# Patient Record
Sex: Female | Born: 1935 | Hispanic: No | Smoking: Current some day smoker
Health system: Southern US, Community
[De-identification: ages and names within clinical notes are randomized; demographics above are authoritative.]

## PROBLEM LIST (undated history)

## (undated) DIAGNOSIS — F32A Depression, unspecified: Secondary | ICD-10-CM

## (undated) DIAGNOSIS — E785 Hyperlipidemia, unspecified: Secondary | ICD-10-CM

## (undated) DIAGNOSIS — IMO0002 Reserved for concepts with insufficient information to code with codable children: Secondary | ICD-10-CM

## (undated) DIAGNOSIS — F329 Major depressive disorder, single episode, unspecified: Secondary | ICD-10-CM

## (undated) DIAGNOSIS — F419 Anxiety disorder, unspecified: Secondary | ICD-10-CM

## (undated) DIAGNOSIS — D519 Vitamin B12 deficiency anemia, unspecified: Secondary | ICD-10-CM

## (undated) DIAGNOSIS — J449 Chronic obstructive pulmonary disease, unspecified: Secondary | ICD-10-CM

## (undated) HISTORY — PX: CHOLECYSTECTOMY: SHX55

## (undated) HISTORY — PX: ABDOMINAL HYSTERECTOMY: SHX81

## (undated) HISTORY — DX: Vitamin B12 deficiency anemia, unspecified: D51.9

## (undated) HISTORY — PX: GALLBLADDER SURGERY: SHX652

---

## 1999-02-05 ENCOUNTER — Encounter: Payer: Self-pay | Admitting: Internal Medicine

## 1999-02-05 ENCOUNTER — Ambulatory Visit (HOSPITAL_COMMUNITY): Admission: RE | Admit: 1999-02-05 | Discharge: 1999-02-05 | Payer: Self-pay | Admitting: Internal Medicine

## 1999-03-03 ENCOUNTER — Encounter (INDEPENDENT_AMBULATORY_CARE_PROVIDER_SITE_OTHER): Payer: Self-pay

## 1999-03-03 ENCOUNTER — Inpatient Hospital Stay (HOSPITAL_COMMUNITY): Admission: RE | Admit: 1999-03-03 | Discharge: 1999-03-05 | Payer: Self-pay | Admitting: *Deleted

## 1999-03-03 ENCOUNTER — Encounter (INDEPENDENT_AMBULATORY_CARE_PROVIDER_SITE_OTHER): Payer: Self-pay | Admitting: Specialist

## 2009-08-01 ENCOUNTER — Emergency Department (HOSPITAL_COMMUNITY): Admission: EM | Admit: 2009-08-01 | Discharge: 2009-08-01 | Payer: Self-pay | Admitting: Family Medicine

## 2009-11-12 ENCOUNTER — Observation Stay (HOSPITAL_COMMUNITY): Admission: EM | Admit: 2009-11-12 | Discharge: 2009-11-13 | Payer: Self-pay | Admitting: Emergency Medicine

## 2009-12-18 ENCOUNTER — Observation Stay (HOSPITAL_COMMUNITY): Admission: EM | Admit: 2009-12-18 | Discharge: 2009-12-20 | Payer: Self-pay | Source: Home / Self Care

## 2010-03-17 LAB — BASIC METABOLIC PANEL
BUN: 8 mg/dL (ref 6–23)
CO2: 26 mEq/L (ref 19–32)
Calcium: 9.3 mg/dL (ref 8.4–10.5)
Chloride: 107 mEq/L (ref 96–112)
Creatinine, Ser: 0.56 mg/dL (ref 0.4–1.2)
GFR calc Af Amer: >60 mL/min (ref >60)
GFR calc non Af Amer: >60 mL/min (ref >60)
Glucose, Bld: 146 mg/dL — ABNORMAL HIGH (ref 70–99)
Potassium: 4.4 mEq/L (ref 3.5–5.1)
Sodium: 137 mEq/L (ref 135–145)

## 2010-03-17 LAB — CARDIAC PANEL(CRET KIN+CKTOT+MB+TROPI)
CK, MB: 2.8 ng/mL (ref 0.3–4.0)
Relative Index: 1.3 (ref 0.0–2.5)
Total CK: 223 U/L — ABNORMAL HIGH (ref 7–177)

## 2010-03-18 LAB — DIFFERENTIAL
Basophils Absolute: 0 10*3/uL (ref 0.0–0.1)
Basophils Absolute: 0 10*3/uL (ref 0.0–0.1)
Basophils Relative: 0 % (ref 0–1)
Lymphocytes Relative: 8 % — ABNORMAL LOW (ref 12–46)
Lymphocytes Relative: 17 % (ref 12–46)
Lymphs Abs: 1.1 10*3/uL (ref 0.7–4.0)
Neutro Abs: 9 10*3/uL — ABNORMAL HIGH (ref 1.7–7.7)
Neutro Abs: 4.5 10*3/uL (ref 1.7–7.7)
Neutrophils Relative %: 83 % — ABNORMAL HIGH (ref 43–77)

## 2010-03-18 LAB — POCT CARDIAC MARKERS
CKMB, poc: 1.1 ng/mL (ref 1.0–8.0)
CKMB, poc: <1 ng/mL — ABNORMAL LOW (ref 1.0–8.0)
Myoglobin, poc: 94.1 ng/mL (ref 12–200)
Myoglobin, poc: 56.4 ng/mL (ref 12–200)
Troponin i, poc: <0.05 ng/mL (ref 0.00–0.09)
Troponin i, poc: <0.05 ng/mL (ref 0.00–0.09)

## 2010-03-18 LAB — CARDIAC PANEL(CRET KIN+CKTOT+MB+TROPI)
CK, MB: 1.4 ng/mL (ref 0.3–4.0)
CK, MB: 1.6 ng/mL (ref 0.3–4.0)
Relative Index: 1.2 (ref 0.0–2.5)
Relative Index: 1.2 (ref 0.0–2.5)
Relative Index: INVALID (ref 0.0–2.5)
Total CK: 204 U/L — ABNORMAL HIGH (ref 7–177)
Total CK: 51 U/L (ref 7–177)
Troponin I: 0.02 ng/mL (ref 0.00–0.06)
Troponin I: <0.01 ng/mL (ref 0.00–0.06)
Troponin I: 0.01 ng/mL (ref 0.00–0.06)

## 2010-03-18 LAB — URINALYSIS, ROUTINE W REFLEX MICROSCOPIC
Bilirubin Urine: NEGATIVE
Bilirubin Urine: NEGATIVE
Glucose, UA: NEGATIVE mg/dL
Hgb urine dipstick: NEGATIVE
Ketones, ur: NEGATIVE mg/dL
Nitrite: NEGATIVE
Nitrite: NEGATIVE
Protein, ur: NEGATIVE mg/dL
Specific Gravity, Urine: 1.015 (ref 1.005–1.030)
Specific Gravity, Urine: 1.01 (ref 1.005–1.030)
Urobilinogen, UA: 0.2 mg/dL (ref 0.0–1.0)
pH: 6.5 (ref 5.0–8.0)

## 2010-03-18 LAB — CBC
HCT: 38.6 % (ref 36.0–46.0)
MCHC: 33.4 g/dL (ref 30.0–36.0)
Platelets: 163 10*3/uL (ref 150–400)
Platelets: 174 10*3/uL (ref 150–400)
RBC: 4.36 MIL/uL (ref 3.87–5.11)
RDW: 14.3 % (ref 11.5–15.5)
RDW: 13.5 % (ref 11.5–15.5)
WBC: 10.8 10*3/uL — ABNORMAL HIGH (ref 4.0–10.5)
WBC: 6.2 10*3/uL (ref 4.0–10.5)

## 2010-03-18 LAB — BASIC METABOLIC PANEL
BUN: 6 mg/dL (ref 6–23)
Calcium: 9.2 mg/dL (ref 8.4–10.5)
GFR calc non Af Amer: >60 mL/min (ref >60)
Glucose, Bld: 120 mg/dL — ABNORMAL HIGH (ref 70–99)
Potassium: 4 mEq/L (ref 3.5–5.1)

## 2010-03-18 LAB — TROPONIN I
Troponin I: <0.01 ng/mL (ref 0.00–0.06)
Troponin I: 0.02 ng/mL (ref 0.00–0.06)

## 2010-03-18 LAB — POCT I-STAT, CHEM 8
BUN: 11 mg/dL (ref 6–23)
Chloride: 104 mEq/L (ref 96–112)
Potassium: 4.5 mEq/L (ref 3.5–5.1)
Sodium: 137 mEq/L (ref 135–145)

## 2010-03-18 LAB — URINE MICROSCOPIC-ADD ON

## 2010-03-18 LAB — CK TOTAL AND CKMB (NOT AT ARMC)
Relative Index: INVALID (ref 0.0–2.5)
Total CK: 169 U/L (ref 7–177)

## 2010-12-03 ENCOUNTER — Inpatient Hospital Stay (HOSPITAL_BASED_OUTPATIENT_CLINIC_OR_DEPARTMENT_OTHER)
Admission: EM | Admit: 2010-12-03 | Discharge: 2010-12-12 | DRG: 189 | Disposition: A | Discharge status: Home Health | Payer: Medicare Other | Attending: Internal Medicine | Admitting: Internal Medicine

## 2010-12-03 ENCOUNTER — Other Ambulatory Visit: Payer: Self-pay

## 2010-12-03 ENCOUNTER — Encounter: Payer: Self-pay | Admitting: Emergency Medicine

## 2010-12-03 ENCOUNTER — Emergency Department (INDEPENDENT_AMBULATORY_CARE_PROVIDER_SITE_OTHER): Payer: Medicare Other

## 2010-12-03 DIAGNOSIS — J9620 Acute and chronic respiratory failure, unspecified whether with hypoxia or hypercapnia: Principal | ICD-10-CM | POA: Diagnosis present

## 2010-12-03 DIAGNOSIS — R0602 Shortness of breath: Secondary | ICD-10-CM

## 2010-12-03 DIAGNOSIS — IMO0001 Reserved for inherently not codable concepts without codable children: Secondary | ICD-10-CM

## 2010-12-03 DIAGNOSIS — F17200 Nicotine dependence, unspecified, uncomplicated: Secondary | ICD-10-CM | POA: Diagnosis present

## 2010-12-03 DIAGNOSIS — F329 Major depressive disorder, single episode, unspecified: Secondary | ICD-10-CM | POA: Diagnosis present

## 2010-12-03 DIAGNOSIS — R05 Cough: Secondary | ICD-10-CM

## 2010-12-03 DIAGNOSIS — R911 Solitary pulmonary nodule: Secondary | ICD-10-CM

## 2010-12-03 DIAGNOSIS — I959 Hypotension, unspecified: Secondary | ICD-10-CM | POA: Diagnosis present

## 2010-12-03 DIAGNOSIS — R748 Abnormal levels of other serum enzymes: Secondary | ICD-10-CM

## 2010-12-03 DIAGNOSIS — R000 Tachycardia, unspecified: Secondary | ICD-10-CM

## 2010-12-03 DIAGNOSIS — J209 Acute bronchitis, unspecified: Secondary | ICD-10-CM | POA: Diagnosis present

## 2010-12-03 DIAGNOSIS — I2510 Atherosclerotic heart disease of native coronary artery without angina pectoris: Secondary | ICD-10-CM

## 2010-12-03 DIAGNOSIS — J440 Chronic obstructive pulmonary disease with acute lower respiratory infection: Secondary | ICD-10-CM | POA: Diagnosis present

## 2010-12-03 DIAGNOSIS — R918 Other nonspecific abnormal finding of lung field: Secondary | ICD-10-CM

## 2010-12-03 DIAGNOSIS — F411 Generalized anxiety disorder: Secondary | ICD-10-CM | POA: Diagnosis present

## 2010-12-03 DIAGNOSIS — E785 Hyperlipidemia, unspecified: Secondary | ICD-10-CM | POA: Diagnosis present

## 2010-12-03 DIAGNOSIS — F3289 Other specified depressive episodes: Secondary | ICD-10-CM | POA: Diagnosis present

## 2010-12-03 DIAGNOSIS — J441 Chronic obstructive pulmonary disease with (acute) exacerbation: Secondary | ICD-10-CM | POA: Diagnosis present

## 2010-12-03 DIAGNOSIS — Z23 Encounter for immunization: Secondary | ICD-10-CM

## 2010-12-03 DIAGNOSIS — Z859 Personal history of malignant neoplasm, unspecified: Secondary | ICD-10-CM

## 2010-12-03 DIAGNOSIS — IMO0002 Reserved for concepts with insufficient information to code with codable children: Secondary | ICD-10-CM

## 2010-12-03 DIAGNOSIS — Z79899 Other long term (current) drug therapy: Secondary | ICD-10-CM

## 2010-12-03 DIAGNOSIS — R509 Fever, unspecified: Secondary | ICD-10-CM

## 2010-12-03 DIAGNOSIS — M6282 Rhabdomyolysis: Secondary | ICD-10-CM | POA: Diagnosis present

## 2010-12-03 DIAGNOSIS — J9621 Acute and chronic respiratory failure with hypoxia: Secondary | ICD-10-CM | POA: Diagnosis present

## 2010-12-03 DIAGNOSIS — D696 Thrombocytopenia, unspecified: Secondary | ICD-10-CM | POA: Diagnosis present

## 2010-12-03 DIAGNOSIS — Z7982 Long term (current) use of aspirin: Secondary | ICD-10-CM

## 2010-12-03 DIAGNOSIS — R7989 Other specified abnormal findings of blood chemistry: Secondary | ICD-10-CM

## 2010-12-03 DIAGNOSIS — Z9981 Dependence on supplemental oxygen: Secondary | ICD-10-CM

## 2010-12-03 HISTORY — DX: Hyperlipidemia, unspecified: E78.5

## 2010-12-03 HISTORY — DX: Major depressive disorder, single episode, unspecified: F32.9

## 2010-12-03 HISTORY — DX: Depression, unspecified: F32.A

## 2010-12-03 HISTORY — DX: Anxiety disorder, unspecified: F41.9

## 2010-12-03 HISTORY — DX: Chronic obstructive pulmonary disease, unspecified: J44.9

## 2010-12-03 HISTORY — DX: Reserved for concepts with insufficient information to code with codable children: IMO0002

## 2010-12-03 LAB — COMPREHENSIVE METABOLIC PANEL
Albumin: 4 g/dL (ref 3.5–5.2)
Alkaline Phosphatase: 63 U/L (ref 39–117)
BUN: 8 mg/dL (ref 6–23)
Calcium: 9.1 mg/dL (ref 8.4–10.5)
GFR calc Af Amer: >90 mL/min (ref >90)
Glucose, Bld: 100 mg/dL — ABNORMAL HIGH (ref 70–99)
Potassium: 4.2 mEq/L (ref 3.5–5.1)
Sodium: 136 mEq/L (ref 135–145)
Total Protein: 6.7 g/dL (ref 6.0–8.3)

## 2010-12-03 LAB — POCT I-STAT 3, ART BLOOD GAS (G3+)
O2 Saturation: 98 %
Patient temperature: 100
pO2, Arterial: 106 mmHg — ABNORMAL HIGH (ref 80.0–100.0)

## 2010-12-03 LAB — PRO B NATRIURETIC PEPTIDE: Pro B Natriuretic peptide (BNP): 997.1 pg/mL — ABNORMAL HIGH (ref 0–125)

## 2010-12-03 LAB — CBC
MCH: 32.8 pg (ref 26.0–34.0)
MCHC: 33.1 g/dL (ref 30.0–36.0)
MCV: 99 fL (ref 78.0–100.0)
Platelets: 146 10*3/uL — ABNORMAL LOW (ref 150–400)

## 2010-12-03 LAB — MAGNESIUM: Magnesium: 1.5 mg/dL (ref 1.5–2.5)

## 2010-12-03 LAB — DIFFERENTIAL
Basophils Relative: 0 % (ref 0–1)
Eosinophils Absolute: 0 10*3/uL (ref 0.0–0.7)
Eosinophils Relative: 0 % (ref 0–5)
Monocytes Relative: 10 % (ref 3–12)
Neutrophils Relative %: 85 % — ABNORMAL HIGH (ref 43–77)

## 2010-12-03 LAB — CARDIAC PANEL(CRET KIN+CKTOT+MB+TROPI)
Relative Index: 3 — ABNORMAL HIGH (ref 0.0–2.5)
Total CK: 179 U/L — ABNORMAL HIGH (ref 7–177)
Troponin I: <0.3 ng/mL (ref <0.30)

## 2010-12-03 MED ORDER — ALUM & MAG HYDROXIDE-SIMETH 200-200-20 MG/5ML PO SUSP: 30.0000 mL | Freq: Four times a day (QID) | ORAL | Status: DC | PRN

## 2010-12-03 MED ORDER — MOXIFLOXACIN HCL IN NACL 400 MG/250ML IV SOLN
400.0000 mg | Freq: Once | INTRAVENOUS | Status: AC
  Administered 2010-12-03: 400 mg via INTRAVENOUS
  Filled 2010-12-03: qty 250

## 2010-12-03 MED ORDER — ACETAMINOPHEN 325 MG PO TABS
650.0000 mg | ORAL_TABLET | Freq: Four times a day (QID) | ORAL | Status: DC | PRN
Start: 2010-12-03 — End: 2010-12-12

## 2010-12-03 MED ORDER — METHYLPREDNISOLONE SODIUM SUCC 125 MG IJ SOLR
125.0000 mg | Freq: Once | INTRAMUSCULAR | Status: AC
  Administered 2010-12-03: 125 mg via INTRAVENOUS
  Filled 2010-12-03: qty 2

## 2010-12-03 MED ORDER — INFLUENZA VIRUS VACC SPLIT PF IM SUSP
0.5000 mL | INTRAMUSCULAR | Status: AC
  Administered 2010-12-04: 0.5 mL via INTRAMUSCULAR
  Filled 2010-12-03: qty 0.5

## 2010-12-03 MED ORDER — ALBUTEROL (5 MG/ML) CONTINUOUS INHALATION SOLN
10.0000 mg/h | INHALATION_SOLUTION | Freq: Once | RESPIRATORY_TRACT | Status: DC
  Administered 2010-12-03: 10 mg/h via RESPIRATORY_TRACT

## 2010-12-03 MED ORDER — FLUOXETINE HCL 20 MG PO CAPS
20.0000 mg | ORAL_CAPSULE | Freq: Every day | ORAL | Status: DC
  Administered 2010-12-03 – 2010-12-12 (×10): 20 mg via ORAL
  Filled 2010-12-03 (×10): qty 1

## 2010-12-03 MED ORDER — ALBUTEROL SULFATE (5 MG/ML) 0.5% IN NEBU
2.5000 mg | INHALATION_SOLUTION | RESPIRATORY_TRACT | Status: DC
  Administered 2010-12-03 (×2): 2.5 mg via RESPIRATORY_TRACT
  Filled 2010-12-03: qty 0.5

## 2010-12-03 MED ORDER — LEVALBUTEROL HCL 0.63 MG/3ML IN NEBU
0.6300 mg | INHALATION_SOLUTION | Freq: Four times a day (QID) | RESPIRATORY_TRACT | Status: DC
  Administered 2010-12-04 – 2010-12-12 (×30): 0.63 mg via RESPIRATORY_TRACT
  Filled 2010-12-03 (×43): qty 3

## 2010-12-03 MED ORDER — SENNOSIDES-DOCUSATE SODIUM 8.6-50 MG PO TABS
1.0000 | ORAL_TABLET | Freq: Every day | ORAL | Status: DC | PRN
  Filled 2010-12-03: qty 1

## 2010-12-03 MED ORDER — ENOXAPARIN SODIUM 40 MG/0.4ML ~~LOC~~ SOLN
40.0000 mg | SUBCUTANEOUS | Status: DC
  Administered 2010-12-03 – 2010-12-11 (×9): 40 mg via SUBCUTANEOUS
  Filled 2010-12-03 (×10): qty 0.4

## 2010-12-03 MED ORDER — ALBUTEROL SULFATE (5 MG/ML) 0.5% IN NEBU
INHALATION_SOLUTION | RESPIRATORY_TRACT | Status: AC
  Filled 2010-12-03: qty 0.5

## 2010-12-03 MED ORDER — GUAIFENESIN ER 600 MG PO TB12
600.0000 mg | ORAL_TABLET | Freq: Two times a day (BID) | ORAL | Status: DC
  Administered 2010-12-03 – 2010-12-06 (×6): 600 mg via ORAL
  Filled 2010-12-03 (×7): qty 1

## 2010-12-03 MED ORDER — IPRATROPIUM BROMIDE 0.02 % IN SOLN
RESPIRATORY_TRACT | Status: AC
  Filled 2010-12-03: qty 2.5

## 2010-12-03 MED ORDER — PANTOPRAZOLE SODIUM 40 MG PO TBEC
40.0000 mg | DELAYED_RELEASE_TABLET | Freq: Every day | ORAL | Status: DC
  Administered 2010-12-03 – 2010-12-12 (×10): 40 mg via ORAL
  Filled 2010-12-03 (×10): qty 1

## 2010-12-03 MED ORDER — MOXIFLOXACIN HCL 400 MG PO TABS
400.0000 mg | ORAL_TABLET | Freq: Every day | ORAL | Status: DC
  Administered 2010-12-03 – 2010-12-10 (×8): 400 mg via ORAL
  Filled 2010-12-03 (×9): qty 1

## 2010-12-03 MED ORDER — ACETAMINOPHEN 650 MG RE SUPP: 650.0000 mg | Freq: Four times a day (QID) | RECTAL | Status: DC | PRN

## 2010-12-03 MED ORDER — NITROGLYCERIN 0.4 MG SL SUBL
0.4000 mg | SUBLINGUAL_TABLET | SUBLINGUAL | Status: DC | PRN
Start: 2010-12-03 — End: 2010-12-12

## 2010-12-03 MED ORDER — GUAIFENESIN-DM 100-10 MG/5ML PO SYRP
5.0000 mL | ORAL_SOLUTION | ORAL | Status: DC | PRN
  Administered 2010-12-04 – 2010-12-10 (×18): 5 mL via ORAL
  Filled 2010-12-03 (×16): qty 5

## 2010-12-03 MED ORDER — FLUOXETINE HCL 20 MG PO TABS: 20.0000 mg | ORAL_TABLET | Freq: Every day | ORAL | Status: DC

## 2010-12-03 MED ORDER — IPRATROPIUM BROMIDE 0.02 % IN SOLN
0.5000 mg | Freq: Four times a day (QID) | RESPIRATORY_TRACT | Status: DC
  Administered 2010-12-04 – 2010-12-12 (×29): 0.5 mg via RESPIRATORY_TRACT
  Filled 2010-12-03 (×32): qty 2.5

## 2010-12-03 MED ORDER — ONDANSETRON HCL 4 MG/2ML IJ SOLN: 4.0000 mg | Freq: Four times a day (QID) | INTRAMUSCULAR | Status: DC | PRN

## 2010-12-03 MED ORDER — PREDNISONE 50 MG PO TABS
60.0000 mg | ORAL_TABLET | Freq: Two times a day (BID) | ORAL | Status: DC
  Administered 2010-12-03 – 2010-12-04 (×2): 60 mg via ORAL
  Filled 2010-12-03 (×3): qty 1

## 2010-12-03 MED ORDER — IOHEXOL 350 MG/ML SOLN
100.0000 mL | Freq: Once | INTRAVENOUS | Status: AC | PRN
  Administered 2010-12-03: 100 mL via INTRAVENOUS

## 2010-12-03 MED ORDER — SIMVASTATIN 40 MG PO TABS
40.0000 mg | ORAL_TABLET | Freq: Every day | ORAL | Status: DC
  Administered 2010-12-03 – 2010-12-11 (×9): 40 mg via ORAL
  Filled 2010-12-03 (×10): qty 1

## 2010-12-03 MED ORDER — BIOTENE DRY MOUTH MT LIQD
15.0000 mL | Freq: Two times a day (BID) | OROMUCOSAL | Status: DC
  Administered 2010-12-04 – 2010-12-12 (×16): 15 mL via OROMUCOSAL

## 2010-12-03 MED ORDER — ASPIRIN 81 MG PO CHEW
81.0000 mg | CHEWABLE_TABLET | Freq: Every day | ORAL | Status: DC
  Administered 2010-12-03 – 2010-12-12 (×10): 81 mg via ORAL
  Filled 2010-12-03 (×10): qty 1

## 2010-12-03 MED ORDER — IPRATROPIUM BROMIDE 0.02 % IN SOLN
0.5000 mg | RESPIRATORY_TRACT | Status: DC
  Administered 2010-12-03 (×2): 0.5 mg via RESPIRATORY_TRACT
  Filled 2010-12-03: qty 2.5

## 2010-12-03 MED ORDER — ALBUTEROL (5 MG/ML) CONTINUOUS INHALATION SOLN
10.0000 mg/h | INHALATION_SOLUTION | RESPIRATORY_TRACT | Status: DC
  Filled 2010-12-03: qty 0.5
  Filled 2010-12-03: qty 20

## 2010-12-03 MED ORDER — ONDANSETRON HCL 4 MG PO TABS: 4.0000 mg | ORAL_TABLET | Freq: Four times a day (QID) | ORAL | Status: DC | PRN

## 2010-12-03 MED ORDER — SODIUM CHLORIDE 0.9 % IV SOLN
INTRAVENOUS | Status: AC
  Administered 2010-12-03: 23:00:00 via INTRAVENOUS

## 2010-12-03 MED ORDER — FLUTICASONE-SALMETEROL 250-50 MCG/DOSE IN AEPB
1.0000 | INHALATION_SPRAY | Freq: Two times a day (BID) | RESPIRATORY_TRACT | Status: DC
  Administered 2010-12-04 – 2010-12-12 (×11): 1 via RESPIRATORY_TRACT
  Filled 2010-12-03 (×2): qty 14

## 2010-12-03 MED ORDER — HYDROCODONE-ACETAMINOPHEN 5-325 MG PO TABS
1.0000 | ORAL_TABLET | ORAL | Status: DC | PRN
  Administered 2010-12-09 – 2010-12-10 (×2): 1 via ORAL
  Filled 2010-12-03: qty 1
  Filled 2010-12-03: qty 2

## 2010-12-03 NOTE — ED Notes (Signed)
Pt resting much more comfortably now, resp rate decreased to 24, O2 sats 100%

## 2010-12-03 NOTE — H&P (Signed)
PCP:  Selena Batten Cardiologist: [facility] cardiology Dr. Isabel Caprice  Chief Complaint:  Short of breath/cough  HPI: Kaitlyn Snyder is a 75 y.o. female   has a past medical history of COPD (chronic obstructive pulmonary disease); Hyperlipidemia; Depression; Squamous carcinoma; and Anxiety.   Presented with  For the past 24 hours she has experienced increased work of breathing increased shortness of breath she hadn't had some cough productive of green mucus. She had low-grade temperature to 100.1. Patient has been on prednisone since October for ongoing COPD. She continues to smoke about a pack a day. She had not any chest pain. When she first presented to [facility] emergency department she was living 40 times a minute and were wheezing extensively. She was given Solu-Medrol given albuterol and Atrovent and put on BiPAP. After her transfer to [facility] step down she improved and currently wishes to BE taken off BiPAP she is feeling better  Review of Systems:    Pertinent Positives: Shortness of breath, fevers, cough, poor appetite  Pertinent Negatives: No chest pain no nausea no vomiting no diarrhea no constipation no myalgias no stiff neck Otherwise ROS are negative except for above, 10 systems were reviewed  Past Medical History: Past Medical History  Diagnosis Date  . COPD (chronic obstructive pulmonary disease)   . Hyperlipidemia   . Depression   . Squamous carcinoma   . Anxiety    Past Surgical History  Procedure Date  . Abdominal hysterectomy   . Cholecystectomy      Medications: Prior to Admission medications   Medication Sig Start Date End Date Taking? Authorizing Provider  albuterol (PROVENTIL HFA;VENTOLIN HFA) 108 (90 BASE) MCG/ACT inhaler Inhale 2 puffs into the lungs every 4 (four) hours as needed.     Yes Historical Provider, MD  aspirin 81 MG tablet Take 81 mg by mouth daily.     Yes Historical Provider, MD  Calcium Carbonate-Vitamin D (CALTRATE 600+D) 600-400  MG-UNIT per tablet Take 1 tablet by mouth 2 (two) times daily.     Yes Historical Provider, MD  cholecalciferol (VITAMIN D) 1000 UNITS tablet Take 4,000 Units by mouth daily.     Yes Historical Provider, MD  conjugated estrogens (PREMARIN) vaginal cream Place 1 g vaginally 3 (three) times a week. Use on Monday, Wednesday and Friday    Yes Historical Provider, MD  Cyanocobalamin (VITAMIN B 12 PO) Take 2,000 mg by mouth.     Yes Historical Provider, MD  cyanocobalamin 2000 MCG tablet Take 2,000 mcg by mouth daily.     Yes Historical Provider, MD  estrogens, conjugated, (PREMARIN) 0.625 MG tablet Take 0.625 mg by mouth daily. As directed    Yes Historical Provider, MD  FLUoxetine (PROZAC) 20 MG tablet Take 20 mg by mouth daily.     Yes Historical Provider, MD  omeprazole (PRILOSEC) 20 MG capsule Take 20 mg by mouth 2 (two) times daily.     Yes Historical Provider, MD  predniSONE (DELTASONE) 10 MG tablet Take 10 mg by mouth every other day.    Yes Historical Provider, MD  simvastatin (ZOCOR) 40 MG tablet Take 40 mg by mouth at bedtime.     Yes Historical Provider, MD  tiotropium (SPIRIVA) 18 MCG inhalation capsule Place 18 mcg into inhaler and inhale daily.     Yes Historical Provider, MD  nitroGLYCERIN (NITROSTAT) 0.4 MG SL tablet Place 0.4 mg under the tongue every 5 (five) minutes as needed.      Historical Provider, MD  Allergies:  No Known Allergies  Social History:  reports that she has been smoking Cigarettes.  She has a 55 pack-year smoking history. She does not have any smokeless tobacco history on file. She reports that she drinks about 6 ounces of alcohol per week. She reports that she does not use illicit drugs.   Family History: family history includes Kidney failure in her brother.    Physical Exam: Patient Vitals for the past 24 hrs:  BP Temp Temp src Pulse Resp SpO2 Height Weight  12/03/10 2118 123/53 mmHg - - 109  23  95 % - -  12/03/10 2009 - - - - - 99 % - -  12/03/10  2000 104/38 mmHg - - 114  27  99 % - -  12/03/10 1929 - 99.5 F (37.5 C) Axillary - - - - -  12/03/10 1926 130/52 mmHg - - 119  - 99 % - -  12/03/10 1717 124/38 mmHg - - 134  33  100 % - -  12/03/10 1644 124/38 mmHg 99.1 F (37.3 C) Oral 142  32  96 % - -  12/03/10 1601 122/43 mmHg - - 130  30  98 % - -  12/03/10 1525 - - - - - 100 % - -  12/03/10 1458 - - - - - 100 % - -  12/03/10 1435 125/43 mmHg 100 F (37.8 C) Oral 129  32  97 % - -  12/03/10 1424 - - - - - 100 % - -  12/03/10 1313 - - - - - 100 % - -  12/03/10 1304 155/52 mmHg 98.5 F (36.9 C) Oral 131  42  - 5\' 2"  (1.575 m) 60.782 kg (134 lb)   Upon arrival to step down unit her blood pressure hasn't went down to 90s over 40s then spontaneously improved back to systolics of 120 patient remains tachycardic with heart rate in 114, respirations now down to mid 20s  Alert and Oriented in No Acute distress, on BiPAP Mucous Membranes and Skin: Dry decreased skin turgor Head Non traumatic, neck supple Heart: Regular rate and rhythm no murmurs appreciated Lungs: Diminished air movement throughout, no wheezes appreciated no crackles Abdomen: soft nontender nondistended Lower extremities: No clubbing cyanosis or edema Neurologically Grossly intact: Moving all 4 extremities equally Skin clean Dry and intact no rash body mass index is 24.51 kg/(m^2).   Labs on Admission:   [facility] 12/03/10 1320  NA 136  K 4.2  CL 100  CO2 27  GLUCOSE 100*  BUN 8  CREATININE 0.50  CALCIUM 9.1  MG 1.5  PHOS 2.9    Basename 12/03/10 1320  AST 22  ALT 14  ALKPHOS 63  BILITOT 0.7  PROT 6.7  ALBUMIN 4.0   No results found for this basename: LIPASE:2,AMYLASE:2 in the last 72 hours  Basename 12/03/10 1320  WBC 6.1  NEUTROABS 5.2  HGB 13.1  HCT 39.6  MCV 99.0  PLT 146*    Basename 12/03/10 1320  CKTOTAL 179*  CKMB 5.3*  CKMBINDEX --  TROPONINI <0.30   No results found for this basename: TSH,T4TOTAL,FREET3,T3FREE,THYROIDAB in  the last 72 hours No results found for this basename: VITAMINB12:2,FOLATE:2,FERRITIN:2,TIBC:2,IRON:2,RETICCTPCT:2 in the last 72 hours No results found for this basename: HGBA1C    Estimated Creatinine Clearance: 54.6 ml/min (by C-G formula based on Cr of 0.5). ABG    Component Value Date/Time   PHART 7.419* 12/03/2010 1442   HCO3 24.0 12/03/2010 1442   TCO2  25 12/03/2010 1442   O2SAT 98.0 12/03/2010 1442     Lab Results  Component Value Date   DDIMER  Value: 0.47        AT THE INHOUSE ESTABLISHED CUTOFF VALUE OF 0.48 ug/mL FEU, THIS ASSAY HAS BEEN DOCUMENTED IN THE LITERATURE TO HAVE A SENSITIVITY AND NEGATIVE PREDICTIVE VALUE OF AT LEAST 98 TO 99%.  THE TEST RESULT SHOULD BE CORRELATED WITH AN ASSESSMENT OF THE CLINICAL PROBABILITY OF DVT / VTE. 12/18/2009     Other results: ED ECG REPORT  Rate: 111  Rhythm: Sinus tachycardia ST&T Change: No ischemic changes noted    Blood Culture No results found for this basename: sdes, specrequest, cult, reptstatus       Radiological Exams on Admission: Ct Angio Chest W/cm &/or Wo Cm  12/03/2010  *RADIOLOGY REPORT*  Clinical Data:  74-year with shortness of breath and tachycardia. Evaluate for pulmonary embolism.  CT ANGIOGRAPHY CHEST WITH CONTRAST  Technique:  Multidetector CT imaging of the chest was performed using the standard protocol during bolus administration of intravenous contrast.  Multiplanar CT image reconstructions including MIPs were obtained to evaluate the vascular anatomy.  Contrast: OMNIPAQUE IOHEXOL 350 MG/ML IV SOLN  Comparison:  Chest radiograph 12/03/2010  Findings:  No evidence for pulmonary emboli.  There are coronary artery calcifications.  There is pericardial fluid along the anterior aspect of the heart.  No significant pleural fluid.  There may be a small hiatal hernia but there is also concern for distal esophageal wall thickening.   No evidence for mediastinal or hilar lymphadenopathy. There is  atherosclerotic disease involving the aortic arch and possible stenosis at the origin of the left subclavian artery.  The trachea and mainstem bronchi are patent.  5 mm pulmonary nodule in the left lower lobe on image 67.  6 mm nodule in the left lower lobe on image 61 and there is a peripheral 6 mm nodule in the posterior left lower lobe on image 63.  There are emphysematous changes in the upper lungs.  A few peripheral nodular densities in the anterior right middle lobe on image 58.  These right middle lobe nodules could be post inflammatory in etiology.  No acute bony abnormality.  Review of the MIP images confirms the above findings.  IMPRESSION: No evidence for a pulmonary embolism.  Concern for wall thickening of the distal esophagus.  These findings are nonspecific and could be further evaluated with an esophagram or endoscopy.  There may be a small hiatal hernia.  Emphysematous changes with a few bilateral pulmonary nodules.  The largest pulmonary nodule measures 6 mm in the left lower lobe. These nodules are indeterminate.  If the patient is at high risk for bronchogenic carcinoma, follow-up chest CT at 6-12 months is recommended.  If the patient is at low risk for bronchogenic carcinoma, follow-up chest CT at 12 months is recommended.  This recommendation follows the consensus statement: Guidelines for Management of Small Pulmonary Nodules Detected on CT Scans: A Statement from the Fleischner Society as published in Radiology 2005; 237:395-400. Online at: DietDisorder.cz.  Coronary artery calcifications.  Small amount of pericardial fluid.  Original Report Authenticated By: Richarda Overlie, M.D.   Dg Chest Portable 1 View  12/03/2010  *RADIOLOGY REPORT*  Clinical Data: Fever, cough, shortness of breath  PORTABLE CHEST - 1 VIEW  Comparison: 12/18/2009  Findings: Heart size is mildly enlarged.  Hyperinflation and prompt interstitial markings are compatible with COPD.  Possible focal consolidation is noted over  the right lower lobe projecting over the right cardiophrenic angle.  No pleural effusion.  IMPRESSION: Possible right lower lobe airspace disease.  Recommend PA and and lateral chest radiographs obtained at full inspiration when the patient is clinically able.  Original Report Authenticated By: Harrel Lemon, M.D.    Assessment/Plan  Present on Admission:  .COPD exacerbation -  - Will initiate prednisone taper, start on Avelox, Albuterol PRN, scheduled Atrovent, ADvair and Mucinex. Titrate O2 to saturation >90%. Follow patients respiratory status.  Ordered tobacco cessation consult  .Hypotension, unspecified - this has currently improved, given gentle IV fluids obtain blood cultures initiate antibiotics.   I doubt this is a sepsis this point but will watch carefully. .Shortness of breath - likely related to COPD given extensive tobacco abuse we will cycle cardiac markers  .Cardiac enzymes elevated - troponin is still within normal limits we'll continue to monitor, continue to watch CK and CK-MB, serial EKGs, currently chest pain-free  .Pulmonary nodules - there is a small patient will need followup CT scan in 6-12 months  .Elevated brain natriuretic peptide (BNP) level - could be possibly related to COPD but given her also shortness of breath will obtain 2D echogram to evaluate cardiac function. At this point she does not appear to be clinically in heart failure     Prophylaxis: Protonix and Lovenox  CODE STATUS: Patient wishes to BE DO NOT RESUSCITATE DO NOT INTUBATE, her friend Lynden Ang is her medical power of attorney   Wren Gallaga 12/03/2010, 9:30 PM

## 2010-12-03 NOTE — ED Notes (Signed)
Arterial Blood Gas drawn on 1st attempt in LR.  Bleeding stopped.  Pt tolerated well. Temp. 100.0.  Pt on 40% VM.

## 2010-12-03 NOTE — ED Provider Notes (Signed)
5:02 PM Asked to see pt because she remains tachycardic and tachypneic despite treatment.  Exam shows lungs clear.  Pt is awake and alert.  She was last hospitalized for COPD last year. She has never been intubated.  Will try her on BiPAP.     Carleene Cooper III, MD 12/04/10 [phone]

## 2010-12-03 NOTE — ED Notes (Signed)
Pt to ED via EMS from MD office w/ c/o [facility] & prod cough x 2 days; hx COPD

## 2010-12-03 NOTE — ED Provider Notes (Signed)
History     CSN: [id number] Arrival date & time: 12/03/2010  1:05 PM   First MD Initiated Contact with Patient 12/03/10 1355      Chief Complaint  Patient presents with  . Shortness of Breath    HPI  74yoF h/o COPD presents with shortness of breath. The patient states that she has been feeling worsening shortness of breath, productive cough for the past 2 days. She's used her albuterol and Spiriva at home with minimal relief of symptoms. States that her shortness of breath became worse this morning she was seen by her primary care doctor who advised her to come to the emergency department via ambulance. The patient states that this feels like her typical COPD. She did have an albuterol nebulizer at her or make her office which helped minimally. She denies chest pain. She denies heart failure. She states that she had fevers at home to 100.8 recently. No sick contacts. She states that her triggers usually include changing weather, perfumes, URI. Denies h/o VTE in self or family. No recent hosp/surg/immob. No h/o cancer. Denies exogenous hormone use, no leg pain or swelling. She did travel to [facility] (3hrs) for Thanksgiving holiday   ED Notes, ED Provider Notes from 12/03/10 0000 to 12/03/10 13:13:08       Shayne Alken, RN 12/03/2010 13:06      Pt to ED via EMS from MD office w/ c/o [facility] & prod cough x 2 days; hx COPD     Past Medical History  Diagnosis Date  . COPD (chronic obstructive pulmonary disease)   . Hyperlipidemia   . Depression   . Squamous carcinoma     Past Surgical History  Procedure Date  . Abdominal hysterectomy   . Cholecystectomy     No family history on file.  History  Substance Use Topics  . Smoking status: Current Everyday Smoker  . Smokeless tobacco: Not on file  . Alcohol Use: Yes     beer q week    OB History    Grav Para Term Preterm Abortions TAB SAB Ect Mult Living                  Review of Systems  All other systems reviewed and  are negative.   except as noted HPI  Allergies  Review of patient's allergies indicates no known allergies.  Home Medications   Current Outpatient Rx  Name Route Sig Dispense Refill  . ALBUTEROL SULFATE HFA 108 (90 BASE) MCG/ACT IN AERS Inhalation Inhale 2 puffs into the lungs every 4 (four) hours as needed.      . ASPIRIN 81 MG PO TABS Oral Take 81 mg by mouth daily.      Marland Kitchen CALCIUM CARBONATE-VITAMIN D 600-400 MG-UNIT PO TABS Oral Take 1 tablet by mouth 2 (two) times daily.      Marland Kitchen VITAMIN D 1000 UNITS PO TABS Oral Take 4,000 Units by mouth daily.      Marland Kitchen ESTROGENS, CONJUGATED 0.625 MG/GM VA CREA Vaginal Place 1 g vaginally 3 (three) times a week. Use on Monday, Wednesday and Friday     . VITAMIN B 12 PO Oral Take 2,000 mg by mouth.      . CYANOCOBALAMIN 2000 MCG PO TABS Oral Take 2,000 mcg by mouth daily.      Marland Kitchen ESTROGENS CONJUGATED 0.625 MG PO TABS Oral Take 0.625 mg by mouth daily. As directed     . FLUOXETINE HCL 20 MG PO TABS Oral Take 20  mg by mouth daily.      Marland Kitchen OMEPRAZOLE 20 MG PO CPDR Oral Take 20 mg by mouth 2 (two) times daily.      Marland Kitchen PREDNISONE 10 MG PO TABS Oral Take 10 mg by mouth every other day.     Marland Kitchen SIMVASTATIN 40 MG PO TABS Oral Take 40 mg by mouth at bedtime.      Marland Kitchen TIOTROPIUM BROMIDE MONOHYDRATE 18 MCG IN CAPS Inhalation Place 18 mcg into inhaler and inhale daily.      Marland Kitchen NITROGLYCERIN 0.4 MG SL SUBL Sublingual Place 0.4 mg under the tongue every 5 (five) minutes as needed.        BP 122/43  Pulse 130  Temp(Src) 100 F (37.8 C) (Oral)  Resp 30  Ht 5\' 2"  (1.575 m)  Wt 134 lb (60.782 kg)  BMI 24.51 kg/m2  SpO2 98%  Physical Exam  Nursing note and vitals reviewed. Constitutional: She is oriented to person, place, and time. She appears well-developed.  HENT:  Head: Atraumatic.  Mouth/Throat: Oropharynx is clear and moist.       Mm dry  Eyes: Conjunctivae and EOM are normal. Pupils are equal, round, and reactive to light.  Neck: Normal range of motion. Neck  supple. No JVD present.  Cardiovascular: Regular rhythm, normal heart sounds and intact distal pulses.        tachycardia  Pulmonary/Chest: No respiratory distress. She has no wheezes. She has no rales.       Kyphosis Distant breath sounds Faint b/ exp wheeze  Speaking in complete sentences  Abdominal: Soft. She exhibits no distension. There is no tenderness. There is no rebound and no guarding.  Musculoskeletal: Normal range of motion. She exhibits no edema and no tenderness.  Neurological: She is alert and oriented to person, place, and time.  Skin: Skin is warm and dry. No rash noted.  Psychiatric: She has a normal mood and affect.    Date: 12/03/2010  Rate: 127   Rhythm: sinus tachycardia  QRS Axis: normal  Intervals: normal  ST/T Wave abnormalities: nonspecific ST changes  Conduction Disutrbances:none  Narrative Interpretation:   Old EKG Reviewed: unchanged   ED Course  Procedures (including critical care time)  Labs Reviewed  CBC - Abnormal; Notable for the following:    Platelets 146 (*)    All other components within normal limits  DIFFERENTIAL - Abnormal; Notable for the following:    Neutrophils Relative 85 (*)    Lymphocytes Relative 5 (*)    Lymphs Abs 0.3 (*)    All other components within normal limits  COMPREHENSIVE METABOLIC PANEL - Abnormal; Notable for the following:    Glucose, Bld 100 (*)    All other components within normal limits  CARDIAC PANEL(CRET KIN+CKTOT+MB+TROPI) - Abnormal; Notable for the following:    Total CK 179 (*)    CK, MB 5.3 (*)    Relative Index 3.0 (*)    All other components within normal limits  PRO B NATRIURETIC PEPTIDE - Abnormal; Notable for the following:    BNP, POC 997.1 (*)    All other components within normal limits  POCT I-STAT 3, BLOOD GAS (G3+) - Abnormal; Notable for the following:    pH, Arterial 7.419 (*)    pO2, Arterial 106.0 (*)    All other components within normal limits   Ct Angio Chest W/cm &/or Wo  Cm  12/03/2010  *RADIOLOGY REPORT*  Clinical Data:  74-year with shortness of breath and tachycardia. Evaluate  for pulmonary embolism.  CT ANGIOGRAPHY CHEST WITH CONTRAST  Technique:  Multidetector CT imaging of the chest was performed using the standard protocol during bolus administration of intravenous contrast.  Multiplanar CT image reconstructions including MIPs were obtained to evaluate the vascular anatomy.  Contrast: OMNIPAQUE IOHEXOL 350 MG/ML IV SOLN  Comparison:  Chest radiograph 12/03/2010  Findings:  No evidence for pulmonary emboli.  There are coronary artery calcifications.  There is pericardial fluid along the anterior aspect of the heart.  No significant pleural fluid.  There may be a small hiatal hernia but there is also concern for distal esophageal wall thickening.   No evidence for mediastinal or hilar lymphadenopathy. There is atherosclerotic disease involving the aortic arch and possible stenosis at the origin of the left subclavian artery.  The trachea and mainstem bronchi are patent.  5 mm pulmonary nodule in the left lower lobe on image 67.  6 mm nodule in the left lower lobe on image 61 and there is a peripheral 6 mm nodule in the posterior left lower lobe on image 63.  There are emphysematous changes in the upper lungs.  A few peripheral nodular densities in the anterior right middle lobe on image 58.  These right middle lobe nodules could be post inflammatory in etiology.  No acute bony abnormality.  Review of the MIP images confirms the above findings.  IMPRESSION: No evidence for a pulmonary embolism.  Concern for wall thickening of the distal esophagus.  These findings are nonspecific and could be further evaluated with an esophagram or endoscopy.  There may be a small hiatal hernia.  Emphysematous changes with a few bilateral pulmonary nodules.  The largest pulmonary nodule measures 6 mm in the left lower lobe. These nodules are indeterminate.  If the patient is at high risk  for bronchogenic carcinoma, follow-up chest CT at 6-12 months is recommended.  If the patient is at low risk for bronchogenic carcinoma, follow-up chest CT at 12 months is recommended.  This recommendation follows the consensus statement: Guidelines for Management of Small Pulmonary Nodules Detected on CT Scans: A Statement from the Fleischner Society as published in Radiology 2005; 237:395-400. Online at: DietDisorder.cz.  Coronary artery calcifications.  Small amount of pericardial fluid.  Original Report Authenticated By: Richarda Overlie, M.D.   Dg Chest Portable 1 View  12/03/2010  *RADIOLOGY REPORT*  Clinical Data: Fever, cough, shortness of breath  PORTABLE CHEST - 1 VIEW  Comparison: 12/18/2009  Findings: Heart size is mildly enlarged.  Hyperinflation and prompt interstitial markings are compatible with COPD. Possible focal consolidation is noted over the right lower lobe projecting over the right cardiophrenic angle.  No pleural effusion.  IMPRESSION: Possible right lower lobe airspace disease.  Recommend PA and and lateral chest radiographs obtained at full inspiration when the patient is clinically able.  Original Report Authenticated By: Harrel Lemon, M.D.    1. COPD bronchitis   2. Shortness of breath   3. Pulmonary nodules   4. Cardiac enzymes elevated   5. Elevated brain natriuretic peptide (BNP) level     MDM  Likely COPD exacerbation. Duoneb, steroids, avelox. Labs, EKG, CXR. Reassess.  Anticipate admission.  Stefano Gaul, MD  CTA negative for PE. Multiple b/l nodules. No infiltrate on CT. Pt getting hour long neb now. D/W [facility] Dr. Jordan Hawks. Admit to stepdown         Forbes Cellar, MD 12/03/10 1626

## 2010-12-04 DIAGNOSIS — J449 Chronic obstructive pulmonary disease, unspecified: Secondary | ICD-10-CM

## 2010-12-04 DIAGNOSIS — R0989 Other specified symptoms and signs involving the circulatory and respiratory systems: Secondary | ICD-10-CM

## 2010-12-04 DIAGNOSIS — R0609 Other forms of dyspnea: Secondary | ICD-10-CM

## 2010-12-04 LAB — CREATININE, SERUM
GFR calc Af Amer: >90 mL/min (ref >90)
GFR calc non Af Amer: >90 mL/min (ref >90)

## 2010-12-04 LAB — CBC
HCT: 36.2 % (ref 36.0–46.0)
MCH: 33.1 pg (ref 26.0–34.0)
Platelets: 133 10*3/uL — ABNORMAL LOW (ref 150–400)
RBC: 3.63 MIL/uL — ABNORMAL LOW (ref 3.87–5.11)
RDW: 13.4 % (ref 11.5–15.5)
WBC: 3.5 10*3/uL — ABNORMAL LOW (ref 4.0–10.5)
WBC: 4.4 10*3/uL (ref 4.0–10.5)

## 2010-12-04 LAB — CARDIAC PANEL(CRET KIN+CKTOT+MB+TROPI)
CK, MB: 10.2 ng/mL (ref 0.3–4.0)
Relative Index: 1.9 (ref 0.0–2.5)
Relative Index: 2 (ref 0.0–2.5)
Total CK: 516 U/L — ABNORMAL HIGH (ref 7–177)
Troponin I: <0.3 ng/mL (ref <0.30)
Troponin I: <0.3 ng/mL (ref <0.30)

## 2010-12-04 LAB — URINALYSIS, ROUTINE W REFLEX MICROSCOPIC
Ketones, ur: 15 mg/dL — AB
Leukocytes, UA: NEGATIVE
Nitrite: NEGATIVE
Protein, ur: NEGATIVE mg/dL
Urobilinogen, UA: 1 mg/dL (ref 0.0–1.0)

## 2010-12-04 LAB — COMPREHENSIVE METABOLIC PANEL
AST: 25 U/L (ref 0–37)
BUN: 10 mg/dL (ref 6–23)
CO2: 25 mEq/L (ref 19–32)
Chloride: 102 mEq/L (ref 96–112)
Creatinine, Ser: 0.53 mg/dL (ref 0.50–1.10)
GFR calc non Af Amer: >90 mL/min (ref >90)
Glucose, Bld: 112 mg/dL — ABNORMAL HIGH (ref 70–99)
Total Bilirubin: 0.5 mg/dL (ref 0.3–1.2)

## 2010-12-04 LAB — HEMOGLOBIN A1C
Hgb A1c MFr Bld: 5.7 % — ABNORMAL HIGH (ref <5.7)
Mean Plasma Glucose: 117 mg/dL — ABNORMAL HIGH (ref <117)

## 2010-12-04 MED ORDER — LORAZEPAM 2 MG/ML IJ SOLN
INTRAMUSCULAR | Status: AC
  Administered 2010-12-04: 0.5 mg via INTRAVENOUS
  Filled 2010-12-04: qty 1

## 2010-12-04 MED ORDER — PREDNISONE 50 MG PO TABS
60.0000 mg | ORAL_TABLET | Freq: Every day | ORAL | Status: DC
  Administered 2010-12-05: 60 mg via ORAL
  Filled 2010-12-04 (×2): qty 1

## 2010-12-04 MED ORDER — LORAZEPAM 2 MG/ML IJ SOLN
0.5000 mg | Freq: Once | INTRAMUSCULAR | Status: AC
  Administered 2010-12-04: 0.5 mg via INTRAVENOUS

## 2010-12-04 NOTE — Progress Notes (Signed)
Pt RR now 28, resting more comfortably.  Remains on BiPAP

## 2010-12-04 NOTE — Progress Notes (Signed)
Patient has BIPAP PRN. IS currently on 2 LPM. Doing well

## 2010-12-04 NOTE — Progress Notes (Signed)
Utilization Review Completed.Arleigh Odowd T11/29/2012   

## 2010-12-04 NOTE — Progress Notes (Signed)
Pt breathing less labored.  Taken off Bipap and placed back on nasal canula.  Will hold dinner tray for now in case needs to go back on BiPAP.   Will continue to monitor resp status.

## 2010-12-04 NOTE — Progress Notes (Signed)
   CARE MANAGEMENT NOTE 12/04/2010  Patient:  Kaitlyn Snyder, Kaitlyn Snyder   Account Number:  [id number]  Date Initiated:  12/04/2010  Documentation initiated by:  Onnie Boer  Subjective/Objective Assessment:   PT WAS ADMITTED WITH COPD EXACERBATION.     Action/Plan:   PROGRESSION OF CARE AND DISCHARGE PLANNING   Anticipated DC Date:  12/08/2010   Anticipated DC Plan:  HOME/SELF CARE         Choice offered to / List presented to:             Status of service:  In process, will continue to follow Medicare Important Message given?   (If response is "NO", the following Medicare IM given date fields will be blank) Date Medicare IM given:   Date Additional Medicare IM given:    Discharge Disposition:    Per UR Regulation:  Reviewed for med. necessity/level of care/duration of stay  Comments:  12/04/2010 Onnie Boer, RN, BSN 1542 UR COMPLETED, PT CONTS TO SMOKE.   SHE WORKS 2 JOBS AND HAS PRN O2 FROM [employer].  WILL F/U ON [facility] NEEDS

## 2010-12-04 NOTE — Progress Notes (Signed)
Resp therapist notified RN that pt RR 40s, HR 125, had to be placed back on BiPAP due to SOB.  Pt anxious.  Dr. Butler Denmark notified order for ativan received.  0.5mg  IV given per order.  Will continue to monitor pt resp status.

## 2010-12-04 NOTE — Progress Notes (Addendum)
Subjective: She asked for the Bipap to be taken off after 4-5 hrs last night. Currently feels a little short of breath and thinks its from talking. Her friend is in the room. Cough is dry today. She is ambulating to bedside commode and has increased dyspnea with this.   Objective: Patient Vitals for the past 24 hrs:  BP Temp Temp src Pulse Resp SpO2 Height Weight  12/04/10 1213 - 98.3 F (36.8 C) Oral - - - - -  12/04/10 1000 111/44 mmHg - - 112  23  94 % - -  12/04/10 0835 - - - - - 92 % - -  12/04/10 0804 - 97.9 F (36.6 C) Oral - - - - -  12/04/10 0800 125/53 mmHg - - 109  31  96 % - -  12/04/10 0529 - - - 103  27  95 % - -  12/04/10 0500 123/55 mmHg - - 114  29  91 % - 60 kg (132 lb 4.4 oz)  12/04/10 0400 114/51 mmHg 98.1 F (36.7 C) Oral 94  27  95 % - -  12/04/10 0300 123/55 mmHg - - 102  25  97 % - -  12/04/10 0200 108/36 mmHg - - 98  25  94 % - -  12/04/10 0100 109/37 mmHg - - 102  25  95 % - -  12/04/10 0019 - 98.4 F (36.9 C) Oral - - - - -  12/04/10 0000 108/53 mmHg - - 105  24  95 % - -  12/03/10 2300 117/42 mmHg - - 111  30  92 % - -  12/03/10 2257 - - - 111  20  95 % - -  12/03/10 2210 111/42 mmHg - - 112  22  96 % - -  12/03/10 2118 123/53 mmHg - - 109  23  95 % - -  12/03/10 2100 120/63 mmHg - - 114  23  97 % - -  12/03/10 2030 108/40 mmHg - - 113  30  99 % - -  12/03/10 2009 - - - - - 99 % - -  12/03/10 2000 104/38 mmHg - - 114  27  99 % - -  12/03/10 1929 - 99.5 F (37.5 C) Axillary - - - 5\' 1"  (1.549 m) 61.7 kg (136 lb 0.4 oz)  12/03/10 1926 130/52 mmHg - - 119  - 99 % - -  12/03/10 1717 124/38 mmHg - - 134  33  100 % - -  12/03/10 1644 124/38 mmHg 99.1 F (37.3 C) Oral 142  32  96 % - -  12/03/10 1601 122/43 mmHg - - 130  30  98 % - -  12/03/10 1525 - - - - - 100 % - -  12/03/10 1458 - - - - - 100 % - -  12/03/10 1435 125/43 mmHg 100 F (37.8 C) Oral 129  32  97 % - -  12/03/10 1424 - - - - - 100 % - -   Weight change:   Intake/Output Summary (Last 24  hours) at 12/04/10 1332 Last data filed at 12/04/10 1214  Gross per 24 hour  Intake    520 ml  Output   1250 ml  Net   -730 ml    Physical Exam: General appearance: alert, cooperative and mild tachypenea Lungs: clear to auscultation bilaterally and No wheezes, good air entry.  Heart: regular rate and rhythm, S1, S2 normal, no murmur, click, rub  or gallop Abdomen: soft, non-tender; bowel sounds normal; no masses,  no organomegaly Extremities: extremities normal, atraumatic, no cyanosis or edema Skin: Skin color, texture, turgor normal. No rashes or lesions Neurologic: Grossly normal  Lab Results:  Basename 12/04/10 0620 12/03/10 2307 12/03/10 1320  NA 138 -- 136  K 3.6 -- 4.2  CL 102 -- 100  CO2 25 -- 27  GLUCOSE 112* -- 100*  BUN 10 -- 8  CREATININE 0.53 0.50 --  CALCIUM 8.7 -- 9.1  MG 1.8 -- 1.5  PHOS 2.8 -- 2.9    Basename 12/04/10 0620 12/03/10 1320  AST 25 22  ALT 14 14  ALKPHOS 54 63  BILITOT 0.5 0.7  PROT 6.1 6.7  ALBUMIN 3.3* 4.0   No results found for this basename: LIPASE:2,AMYLASE:2 in the last 72 hours  Basename 12/04/10 0620 12/03/10 2307 12/03/10 1320  WBC 4.4 3.5* --  NEUTROABS -- -- 5.2  HGB 12.0 12.2 --  HCT 35.7* 36.2 --  MCV 98.3 97.3 --  PLT 133* 121* --    Basename 12/04/10 1223 12/04/10 0620 12/03/10 2309  CKTOTAL 516* 423* 304*  CKMB 10.2* 8.1* 5.5*  CKMBINDEX -- -- --  TROPONINI <0.30 <0.30 <0.30    Basename 12/04/10 0620 12/03/10 1320  POCBNP 812.0* 997.1*     Micro Results: Recent Results (from the past 240 hour(s))  MRSA PCR SCREENING     Status: Normal   Collection Time   12/03/10  7:24 PM      Component Value Range Status Comment   MRSA by PCR NEGATIVE  NEGATIVE  Final     Studies/Results: Ct Angio Chest W/cm &/or Wo Cm  12/03/2010  *RADIOLOGY REPORT*  Clinical Data:  74-year with shortness of breath and tachycardia. Evaluate for pulmonary embolism.  CT ANGIOGRAPHY CHEST WITH CONTRAST  Technique:  Multidetector CT  imaging of the chest was performed using the standard protocol during bolus administration of intravenous contrast.  Multiplanar CT image reconstructions including MIPs were obtained to evaluate the vascular anatomy.  Contrast: OMNIPAQUE IOHEXOL 350 MG/ML IV SOLN  Comparison:  Chest radiograph 12/03/2010  Findings:  No evidence for pulmonary emboli.  There are coronary artery calcifications.  There is pericardial fluid along the anterior aspect of the heart.  No significant pleural fluid.  There may be a small hiatal hernia but there is also concern for distal esophageal wall thickening.   No evidence for mediastinal or hilar lymphadenopathy. There is atherosclerotic disease involving the aortic arch and possible stenosis at the origin of the left subclavian artery.  The trachea and mainstem bronchi are patent.  5 mm pulmonary nodule in the left lower lobe on image 67.  6 mm nodule in the left lower lobe on image 61 and there is a peripheral 6 mm nodule in the posterior left lower lobe on image 63.  There are emphysematous changes in the upper lungs.  A few peripheral nodular densities in the anterior right middle lobe on image 58.  These right middle lobe nodules could be post inflammatory in etiology.  No acute bony abnormality.  Review of the MIP images confirms the above findings.  IMPRESSION: No evidence for a pulmonary embolism.  Concern for wall thickening of the distal esophagus.  These findings are nonspecific and could be further evaluated with an esophagram or endoscopy.  There may be a small hiatal hernia.  Emphysematous changes with a few bilateral pulmonary nodules.  The largest pulmonary nodule measures 6 mm in the  left lower lobe. These nodules are indeterminate.  If the patient is at high risk for bronchogenic carcinoma, follow-up chest CT at 6-12 months is recommended.  If the patient is at low risk for bronchogenic carcinoma, follow-up chest CT at 12 months is recommended.  This  recommendation follows the consensus statement: Guidelines for Management of Small Pulmonary Nodules Detected on CT Scans: A Statement from the Fleischner Society as published in Radiology 2005; 237:395-400. Online at: DietDisorder.cz.  Coronary artery calcifications.  Small amount of pericardial fluid.  Original Report Authenticated By: Richarda Overlie, M.D.   Dg Chest Portable 1 View  12/03/2010  *RADIOLOGY REPORT*  Clinical Data: Fever, cough, shortness of breath  PORTABLE CHEST - 1 VIEW  Comparison: 12/18/2009  Findings: Heart size is mildly enlarged.  Hyperinflation and prompt interstitial markings are compatible with COPD. Possible focal consolidation is noted over the right lower lobe projecting over the right cardiophrenic angle.  No pleural effusion.  IMPRESSION: Possible right lower lobe airspace disease.  Recommend PA and and lateral chest radiographs obtained at full inspiration when the patient is clinically able.  Original Report Authenticated By: Harrel Lemon, M.D.    Medications: Scheduled Meds:   . antiseptic oral rinse  15 mL Mouth Rinse BID  . aspirin  81 mg Oral Daily  . enoxaparin  40 mg Subcutaneous Q24H  . FLUoxetine  20 mg Oral Daily  . Fluticasone-Salmeterol  1 puff Inhalation BID  . guaiFENesin  600 mg Oral BID  . influenza  inactive virus vaccine  0.5 mL Intramuscular Tomorrow-1000  . ipratropium  0.5 mg Nebulization Q6H  . levalbuterol  0.63 mg Nebulization Q6H  . methylPREDNISolone (SOLU-MEDROL) injection  125 mg Intravenous Once  . moxifloxacin  400 mg Intravenous Once  . moxifloxacin  400 mg Oral Q2000  . pantoprazole  40 mg Oral Q1200  . predniSONE  60 mg Oral QAC breakfast  . simvastatin  40 mg Oral QHS  . DISCONTD: albuterol  2.5 mg Nebulization Q4H  . DISCONTD: albuterol  10 mg/hr Nebulization Once  . DISCONTD: FLUoxetine  20 mg Oral Daily  . DISCONTD: ipratropium  0.5 mg Nebulization Q4H  . DISCONTD: predniSONE  60  mg Oral BID   Continuous Infusions:   . sodium chloride 50 mL/hr at 12/03/10 2240  . DISCONTD: albuterol     PRN Meds:.acetaminophen, acetaminophen, alum & mag hydroxide-simeth, guaiFENesin-dextromethorphan, HYDROcodone-acetaminophen, iohexol, nitroGLYCERIN, ondansetron (ZOFRAN) IV, ondansetron, senna-docusate  Assessment/Plan: Principal Problem:  *COPD exacerbation- Decrease Prednisone from 60 BID to Daily. Continue to monitor in step down today. She is on O2 at home as needed.   Acute Bronchitis- Presented with low grade fever (100.1) and cough with green mucous. Continue Avelox day2/   Lung Nodules on CT chest- f/u with CT in 6 months. She is a high risk for cancer.   Mild Rhabdomyolysis- likely from work of breathing. Cont slow IV hydration.   Thickened distal esophagus- esophagitis? On BID Prilosec at home.   Hypotension, unspecified- BP still a little low at 111/44. May be normal for her.  Thrombocytopenia- f/u as outpt.  Nicotine abuse     LOS: 1 day   [facility] 12/04/2010, 1:32 PM

## 2010-12-04 NOTE — Progress Notes (Signed)
Received call regarding patient's tachypenia and anxious state. Ativan ordered. Pt placed back on Bipap.Per nurse, when she became more tachypenic, her family was in room and she had been talking to them. I suspect this was more anxiety than COPD related as her lungs were quite clear when I examined her not too long before this call.

## 2010-12-05 DIAGNOSIS — J9621 Acute and chronic respiratory failure with hypoxia: Secondary | ICD-10-CM | POA: Diagnosis present

## 2010-12-05 DIAGNOSIS — J209 Acute bronchitis, unspecified: Secondary | ICD-10-CM | POA: Diagnosis present

## 2010-12-05 DIAGNOSIS — M6282 Rhabdomyolysis: Secondary | ICD-10-CM | POA: Diagnosis present

## 2010-12-05 LAB — HEMOGLOBIN A1C: Hgb A1c MFr Bld: 5.4 % (ref <5.7)

## 2010-12-05 LAB — URINE CULTURE

## 2010-12-05 LAB — GLUCOSE, CAPILLARY: Glucose-Capillary: 105 mg/dL — ABNORMAL HIGH (ref 70–99)

## 2010-12-05 MED ORDER — INSULIN ASPART 100 UNIT/ML ~~LOC~~ SOLN
0.0000 [IU] | SUBCUTANEOUS | Status: DC
  Administered 2010-12-05: 2 [IU] via SUBCUTANEOUS
  Administered 2010-12-06 (×2): 1 [IU] via SUBCUTANEOUS
  Filled 2010-12-05: qty 3

## 2010-12-05 MED ORDER — METHYLPREDNISOLONE SODIUM SUCC 125 MG IJ SOLR
125.0000 mg | Freq: Three times a day (TID) | INTRAMUSCULAR | Status: DC
  Administered 2010-12-05 – 2010-12-07 (×7): 125 mg via INTRAVENOUS
  Filled 2010-12-05 (×9): qty 2

## 2010-12-05 NOTE — Progress Notes (Signed)
Subjective: Alert. Had to go back on CPAP this am due to respiratory distress. Feels better on CPAP. Fatigued. Denies chest pain fevers chills nausea vomiting or abdominal pain.  Objective: Vital signs in last 24 hours: Temp:  [97.8 F (36.6 C)-98.3 F (36.8 C)] 97.9 F (36.6 C) (11/30 0858) Pulse Rate:  [99-120] 99  (11/30 0858) Resp:  [21-36] 27  (11/30 0858) BP: (102-132)/(49-77) 108/56 mmHg (11/30 0858) SpO2:  [91 %-100 %] 100 % (11/30 0858) FiO2 (%):  [30 %] 30 % (11/29 1800) Weight:  [60.782 kg (134 lb)] 134 lb (60.782 kg) (11/30 0500) Weight change: 0 kg (0 lb) Last BM Date: 12/03/10  Intake/Output from previous day: 11/29 0701 - 11/30 0700 In: 360 [P.O.:360] Out: 900 [Urine:900] Intake/Output this shift:   General appearance: alert, cooperative, appears stated age, mild distress and mildly obese Resp: diminished breath sounds bibasilar and wheezes bilaterally, on CPAP 6 cm with 2 liter oxygen bleed-in Cardio: regular rate and rhythm, S1, S2 normal, no murmur, click, rub or gallop, prior tachycardia has resolved GI: soft, non-tender; bowel sounds normal; no masses,  no organomegaly Extremities: extremities normal, atraumatic, no cyanosis or edema Neurologic: Grossly normal  Lab Results:  [facility] 12/04/10 0620 12/03/10 2307  WBC 4.4 3.5*  HGB 12.0 12.2  HCT 35.7* 36.2  PLT 133* 121*   BMET  Basename 12/04/10 0620 12/03/10 2307 12/03/10 1320  NA 138 -- 136  K 3.6 -- 4.2  CL 102 -- 100  CO2 25 -- 27  GLUCOSE 112* -- 100*  BUN 10 -- 8  CREATININE 0.53 0.50 --  CALCIUM 8.7 -- 9.1    Studies/Results: Ct Angio Chest W/cm &/or Wo Cm  12/03/2010  *RADIOLOGY REPORT*  Clinical Data:  74-year with shortness of breath and tachycardia. Evaluate for pulmonary embolism.  CT ANGIOGRAPHY CHEST WITH CONTRAST  Technique:  Multidetector CT imaging of the chest was performed using the standard protocol during bolus administration of intravenous contrast.  Multiplanar CT image  reconstructions including MIPs were obtained to evaluate the vascular anatomy.  Contrast: OMNIPAQUE IOHEXOL 350 MG/ML IV SOLN  Comparison:  Chest radiograph 12/03/2010  Findings:  No evidence for pulmonary emboli.  There are coronary artery calcifications.  There is pericardial fluid along the anterior aspect of the heart.  No significant pleural fluid.  There may be a small hiatal hernia but there is also concern for distal esophageal wall thickening.   No evidence for mediastinal or hilar lymphadenopathy. There is atherosclerotic disease involving the aortic arch and possible stenosis at the origin of the left subclavian artery.  The trachea and mainstem bronchi are patent.  5 mm pulmonary nodule in the left lower lobe on image 67.  6 mm nodule in the left lower lobe on image 61 and there is a peripheral 6 mm nodule in the posterior left lower lobe on image 63.  There are emphysematous changes in the upper lungs.  A few peripheral nodular densities in the anterior right middle lobe on image 58.  These right middle lobe nodules could be post inflammatory in etiology.  No acute bony abnormality.  Review of the MIP images confirms the above findings.  IMPRESSION: No evidence for a pulmonary embolism.  Concern for wall thickening of the distal esophagus.  These findings are nonspecific and could be further evaluated with an esophagram or endoscopy.  There may be a small hiatal hernia.  Emphysematous changes with a few bilateral pulmonary nodules.  The largest pulmonary nodule measures  6 mm in the left lower lobe. These nodules are indeterminate.  If the patient is at high risk for bronchogenic carcinoma, follow-up chest CT at 6-12 months is recommended.  If the patient is at low risk for bronchogenic carcinoma, follow-up chest CT at 12 months is recommended.  This recommendation follows the consensus statement: Guidelines for Management of Small Pulmonary Nodules Detected on CT Scans: A Statement from the  Fleischner Society as published in Radiology 2005; 237:395-400. Online at: DietDisorder.cz.  Coronary artery calcifications.  Small amount of pericardial fluid.  Original Report Authenticated By: Richarda Overlie, M.D.   Dg Chest Portable 1 View  12/03/2010  *RADIOLOGY REPORT*  Clinical Data: Fever, cough, shortness of breath  PORTABLE CHEST - 1 VIEW  Comparison: 12/18/2009  Findings: Heart size is mildly enlarged.  Hyperinflation and prompt interstitial markings are compatible with COPD. Possible focal consolidation is noted over the right lower lobe projecting over the right cardiophrenic angle.  No pleural effusion.  IMPRESSION: Possible right lower lobe airspace disease.  Recommend PA and and lateral chest radiographs obtained at full inspiration when the patient is clinically able.  Original Report Authenticated By: Harrel Lemon, M.D.    Medications: I have reviewed the patient's current medications.  Assessment/Plan:  Principal Problem:  *COPD exacerbation/ Acute and chronic respiratory failure with hypoxia Since required recurrent CPAP treatment will change Prednisone to IV Solumedrol. Is on chronic Prednisone-low dose. Continue nebs and supportive care. DNR/DNI. Need to clarify if on oxygen at home.  Active Problems:  Hypotension, unspecified/ Rhabdomyolysis Possibly baseline but likely due to dehydration. Continue gentle IVF hydration.   Shortness of breath/Bronchitis, acute, with bronchospasm As above. Continue Avelox and mucinex.  Tobacco abuse Certainly contributing to lack of progress in treatment of COPD   Cardiac enzymes elevated Only CPK elevated- likely due to dehydration and work of breathing  Thickened distal esophagus On BID Prilosec at home  Thrombocytopenia Improving- ? If from SIRS and poor perfusion with recent hypotension. Follow   Pulmonary nodules Follow up CT in 6 months. High risk for cancer given known tobacco  abuse.   Elevated brain natriuretic peptide (BNP) level  No evidence of HF on exam- actually dry. ECHO this admit shows preserved LV function and no RV dilatation and function is normal. Could not estimate PA pressure.   Disposition Remain in SDU   LOS: 2 days   Junious Silk, ANP pager [phone] 12/05/2010, 11:55 AM  I have personally examined this patient and reviewed the entire database. I have reviewed the above note, made any necessary editorial changes, and agree with its content.  Lonia Blood, MD [facility]

## 2010-12-06 LAB — BASIC METABOLIC PANEL
Calcium: 8.7 mg/dL (ref 8.4–10.5)
Chloride: 103 mEq/L (ref 96–112)
Creatinine, Ser: 0.52 mg/dL (ref 0.50–1.10)
GFR calc Af Amer: >90 mL/min (ref >90)
Sodium: 138 mEq/L (ref 135–145)

## 2010-12-06 LAB — CBC
Hemoglobin: 13 g/dL (ref 12.0–15.0)
MCH: 33.3 pg (ref 26.0–34.0)
MCV: 98.2 fL (ref 78.0–100.0)
RBC: 3.9 MIL/uL (ref 3.87–5.11)
WBC: 5.8 10*3/uL (ref 4.0–10.5)

## 2010-12-06 LAB — DIFFERENTIAL
Basophils Absolute: 0 10*3/uL (ref 0.0–0.1)
Basophils Relative: 0 % (ref 0–1)
Eosinophils Relative: 0 % (ref 0–5)
Lymphocytes Relative: 12 % (ref 12–46)
Lymphs Abs: 0.7 10*3/uL (ref 0.7–4.0)
Monocytes Relative: 5 % (ref 3–12)
Neutrophils Relative %: 83 % — ABNORMAL HIGH (ref 43–77)

## 2010-12-06 LAB — GLUCOSE, CAPILLARY

## 2010-12-06 MED ORDER — GUAIFENESIN ER 600 MG PO TB12
1200.0000 mg | ORAL_TABLET | Freq: Two times a day (BID) | ORAL | Status: DC
  Administered 2010-12-06 – 2010-12-12 (×12): 1200 mg via ORAL
  Filled 2010-12-06 (×13): qty 2

## 2010-12-06 MED ORDER — MORPHINE SULFATE 2 MG/ML IJ SOLN: 1.0000 mg | INTRAMUSCULAR | Status: DC | PRN

## 2010-12-06 MED ORDER — BENZONATATE 100 MG PO CAPS
200.0000 mg | ORAL_CAPSULE | Freq: Three times a day (TID) | ORAL | Status: DC
  Administered 2010-12-06 – 2010-12-08 (×6): 200 mg via ORAL
  Filled 2010-12-06 (×11): qty 2

## 2010-12-06 NOTE — Progress Notes (Signed)
Notified Dr. Sharon Seller in regards to pt experiencing less than 30 seconds of SVT during which she was asymptomatic.  No new orders received.

## 2010-12-06 NOTE — Progress Notes (Signed)
Notified MD patient had 5 beat run of v-tach, was asymptomatic, BP 125/53 with pulse rate 93 post event assessment, O2 sat 96 on 2L [place].  Patient with no complaints, will continue to monitor patient.

## 2010-12-06 NOTE — Progress Notes (Signed)
Subjective: The patient feels much better today. She is currently off of CPAP and doing well. She states that her breathing is still not back to her baseline but is significantly improved compared to yesterday.  Objective: Vital signs in last 24 hours: Temp:  [97.5 F (36.4 C)-99.1 F (37.3 C)] 99.1 F (37.3 C) (12/01 1229) Pulse Rate:  [90-109] 95  (12/01 1507) Resp:  [20-26] 20  (12/01 1507) BP: (114-149)/(49-75) 125/57 mmHg (12/01 1200) SpO2:  [92 %-99 %] 95 % (12/01 1508) FiO2 (%):  [40 %] 40 % (12/01 0800) Weight:  [58.6 kg (129 lb 3 oz)] 129 lb 3 oz (58.6 kg) (12/01 0012) Weight change: -2.182 kg (-4 lb 13 oz) Last BM Date: 12/05/10  Intake/Output from previous day: 11/30 0701 - 12/01 0700 In: 200 [P.O.:200] Out: 1 [Stool:1] Intake/Output this shift: Total I/O In: 360 [P.O.:360] Out: 150 [Urine:150]  General appearance: alert, cooperative, only mild distress with cough  Resp: Very distant breath sounds throughout all fields but significantly decreased wheezing appreciated on exam today Cardio: Very distant heart sounds- regular rate and rhythm, S1, S2 normal, no murmur, click, rub or gallop GI: soft, non-tender; bowel sounds normal; no masses,  no organomegaly Extremities: No significant cyanosis clubbing or edema bilateral lower extremities Neurologic: Grossly normal  Lab Results:  Basename 12/06/10 0600 12/04/10 0620  WBC 5.8 4.4  HGB 13.0 12.0  HCT 38.3 35.7*  PLT 137* 133*   BMET  Basename 12/06/10 0600 12/04/10 0620  NA 138 138  K 3.8 3.6  CL 103 102  CO2 29 25  GLUCOSE 122* 112*  BUN 20 10  CREATININE 0.52 0.53  CALCIUM 8.7 8.7    Studies/Results: No results found.  Medications: I have reviewed the patient's current medications.  Assessment/Plan:  COPD exacerbation/ Acute and chronic respiratory failure with hypoxia The patient has now been liberated from CPAP-we will continue her current steroid dose without change today-the patient is on  chronic oral steroids at home-she also confirms to me that she is on home oxygen-we will attempt to minimize her cough which seems to exacerbate her shortness of breath   Hypotension The patient's blood pressure has improved and is now stable-the patient never had a significantly elevated CK to truly me the diagnosis of rhabdo   Shortness of breath/Bronchitis, acute, with bronchospasm As above. Continue Avelox and mucinex.  Tobacco abuse Certainly contributing to lack of progress in treatment of COPD-I. have discussed with her that discontinuing smoking is the #1 step that she can take to prevent her readmission   Cardiac enzymes elevated Only CPK elevated- likely due to dehydration and work of breathing  Thickened distal esophagus On BID Prilosec at home  Thrombocytopenia Etiology unclear-improving   Pulmonary nodules Follow up CT in 6 months. High risk for cancer given known tobacco abuse.   Elevated brain natriuretic peptide (BNP) level No clinical evidence of true heart failure   Disposition Remain in SDU   LOS: 3 days  12/06/2010, 3:38 PM  Lonia Blood, MD [facility]

## 2010-12-07 LAB — CBC
HCT: 38.1 % (ref 36.0–46.0)
Hemoglobin: 12.8 g/dL (ref 12.0–15.0)
MCV: 97.9 fL (ref 78.0–100.0)
Platelets: 138 10*3/uL — ABNORMAL LOW (ref 150–400)
RBC: 3.89 MIL/uL (ref 3.87–5.11)
WBC: 7.2 10*3/uL (ref 4.0–10.5)

## 2010-12-07 LAB — BASIC METABOLIC PANEL
CO2: 32 mEq/L (ref 19–32)
Chloride: 101 mEq/L (ref 96–112)
Sodium: 136 mEq/L (ref 135–145)

## 2010-12-07 LAB — GLUCOSE, CAPILLARY: Glucose-Capillary: 134 mg/dL — ABNORMAL HIGH (ref 70–99)

## 2010-12-07 MED ORDER — CALCIUM CARBONATE-VITAMIN D 500-200 MG-UNIT PO TABS
1.0000 | ORAL_TABLET | Freq: Two times a day (BID) | ORAL | Status: DC
  Administered 2010-12-07 – 2010-12-12 (×10): 1 via ORAL
  Filled 2010-12-07 (×11): qty 1

## 2010-12-07 MED ORDER — VITAMIN B-12 1000 MCG PO TABS
2000.0000 ug | ORAL_TABLET | Freq: Every day | ORAL | Status: DC
  Administered 2010-12-07 – 2010-12-12 (×6): 2000 ug via ORAL
  Filled 2010-12-07 (×6): qty 2

## 2010-12-07 MED ORDER — METHYLPREDNISOLONE SODIUM SUCC 125 MG IJ SOLR
60.0000 mg | Freq: Three times a day (TID) | INTRAMUSCULAR | Status: DC
  Administered 2010-12-07 – 2010-12-08 (×3): 60 mg via INTRAVENOUS
  Filled 2010-12-07 (×3): qty 0.96

## 2010-12-07 NOTE — Progress Notes (Signed)
12/07/10  Pt transferred from 2600, a/o, nonprod cough, lungs congested, skin intact, sml gauze dsg on right forearm from veinipuncture pt states, NSL in left a/c patent but outdated tonight,02 @ 2lpm, uses 02 at home as needed, is independent but can only walk short distances with sob with exertion, Full Code, po abx, telemetry ST @ 103 rate,  Plan: treat with IV steriods & continue abx monitoring labs, vital signs & using prn medications for cough & pain.   Bonney Leitz RN

## 2010-12-07 NOTE — Progress Notes (Addendum)
Subjective: The patient reports that her breathing continues to slowly improve. She still does not feel that she is at her baseline but she does report that she feels better than yesterday. An ongoing cough continues to be an issue. She has not yet ambulated significantly and she does report that she becomes quite dyspneic when she attempts to exert herself even to a limited extent in her room.  She denies difficulty swallowing pain in the chest fevers chills or abdominal pain.  Objective: Vital signs in last 24 hours: Temp:  [96.9 F (36.1 C)-99.3 F (37.4 C)] 98.4 F (36.9 C) (12/02 1229) Pulse Rate:  [82-106] 97  (12/02 1200) Resp:  [18-50] 20  (12/02 1200) BP: (114-162)/(47-107) 121/53 mmHg (12/02 1229) SpO2:  [94 %-99 %] 97 % (12/02 1200) Weight:  [60.5 kg (133 lb 6.1 oz)] 133 lb 6.1 oz (60.5 kg) (12/02 0500) Weight change: 1.9 kg (4 lb 3 oz) Last BM Date: 12/05/10  Intake/Output from previous day: 12/01 0701 - 12/02 0700 In: 840 [P.O.:840] Out: 600 [Urine:600] Intake/Output this shift: Total I/O In: 120 [P.O.:120] Out: 250 [Urine:250]  General appearance: alert, cooperative, only mild distress with cough  Resp: Very distant breath sounds throughout all fields but significantly decreased wheezing appreciated on exam again today Cardio: Very distant heart sounds- regular rate and rhythm, S1, S2 normal, no murmur, click, rub or gallop GI: soft, non-tender; bowel sounds normal; no masses,  no organomegaly Extremities: No significant cyanosis clubbing or edema bilateral lower extremities Neurologic: Grossly normal  Lab Results:  Basename 12/07/10 0610 12/06/10 0600  WBC 7.2 5.8  HGB 12.8 13.0  HCT 38.1 38.3  PLT 138* 137*   BMET  Basename 12/07/10 0610 12/06/10 0600  NA 136 138  K 4.2 3.8  CL 101 103  CO2 32 29  GLUCOSE 135* 122*  BUN 16 20  CREATININE 0.54 0.52  CALCIUM 8.8 8.7    Studies/Results: No results found.  Medications: I have reviewed the patient's  current medications.  Assessment/Plan:  COPD exacerbation/ Acute and chronic respiratory failure with hypoxia The patient has now been liberated from CPAP-we will begin to wean her high-dose steroids-the patient is on chronic oral steroids at home-she also confirms to me that she is on home oxygen-we will attempt to minimize her cough which seems to exacerbate her shortness of breath-she would benefit from a pulmonary consult in the outpatient setting   Hypotension The patient's blood pressure has improved and is now stable  Acute Bronchitis Continue Avelox and mucinex - we will recheck a chest x-ray in the morning to assure the patient has not developed a new infiltrate  Tobacco abuse Certainly contributing to lack of progress in treatment of COPD-I have discussed with her that discontinuing smoking is the #1 step that she can take to prevent her readmission-a tobacco cessation consult has been ordered   Cardiac enzymes elevated Only CPK elevated- likely due to dehydration and work of breathing-much improved on f/u today  Thickened distal esophagus On BID Prilosec at home-likely the result of reflux espohagitis-EGD will be recommended in the outpt setting, unless severe coughing persists despite improvement in COPD, in which case we may need to investigate this finding prior to D/C  Thrombocytopenia Etiology unclear-improving-follow   Pulmonary nodules Follow up CT in 6 months. High risk for cancer given known tobacco abuse.   Elevated brain natriuretic peptide (BNP) level No clinical evidence of true heart failure   Disposition Ready for transfer to floor - will  likely require an additional 48hrs+ to accomplish stability for d/c home   Code Status  The patient informs me that she misunderstood the questioning at her admission, and that she would indeed wish to undergo CPR, Code Blue, Intubation, and short-term ventilation should she suffer respiratory or cardiac arrest - I have  therefore changed her status to FULL CODE    LOS: 4 days  12/07/2010, 1:19 PM  Lonia Blood, MD [facility]

## 2010-12-08 ENCOUNTER — Inpatient Hospital Stay (HOSPITAL_COMMUNITY): Payer: Medicare Other

## 2010-12-08 ENCOUNTER — Other Ambulatory Visit: Payer: Self-pay

## 2010-12-08 MED ORDER — HYDROCOD POLST-CHLORPHEN POLST 10-8 MG/5ML PO LQCR
5.0000 mL | Freq: Two times a day (BID) | ORAL | Status: DC | PRN
  Administered 2010-12-08 – 2010-12-10 (×4): 5 mL via ORAL
  Filled 2010-12-08 (×4): qty 5

## 2010-12-08 MED ORDER — METHYLPREDNISOLONE SODIUM SUCC 40 MG IJ SOLR
40.0000 mg | Freq: Three times a day (TID) | INTRAMUSCULAR | Status: DC
  Administered 2010-12-08 – 2010-12-10 (×6): 40 mg via INTRAVENOUS
  Filled 2010-12-08 (×7): qty 1

## 2010-12-08 NOTE — Progress Notes (Signed)
Subjective: Patient says she's feeling 100% better and thinks that her breathing is almost to the baseline. She uses oxygen at home on and off. She has intractable dry cough. She continues to smoke at home.  She says she is seen a cardiologist and has had 2 stress test done in the last one a year ago which was said to be normal. A short while ago she had a 9 beat non-sustained ventricular tachycardia which was asymptomatic.  Objective: Vital signs in last 24 hours: Temp:  [98.1 F (36.7 C)-98.4 F (36.9 C)] 98.1 F (36.7 C) (12/03 1514) Pulse Rate:  [91-105] 97  (12/03 1514) Resp:  [20-21] 20  (12/03 1514) BP: (125-146)/(67-76) 146/76 mmHg (12/03 1514) SpO2:  [91 %-96 %] 92 % (12/03 1514) Weight change:  Last BM Date: 12/07/10  Intake/Output from previous day: 12/02 0701 - 12/03 0700 In: 840 [P.O.:840] Out: 650 [Urine:650] Intake/Output this shift: Total I/O In: 480 [P.O.:480] Out: -   General appearance: alert, cooperative, dry hacking cough.  Resp: Distant breath sounds but breath sounds heard in all lung fields. Occasional rhonchi posteriorly. Minimally increased work of breathing when talking. Cardio: First and second heart sounds heard, regular. Telemetry shows mostly sinus rhythm in the 90s with some PVCs. An episode of 9 beat non-sustained ventricular tachycardia. GI: soft, non-tender; bowel sounds normal; no masses,  no organomegaly Extremities: No significant cyanosis clubbing or edema bilateral lower extremities Neurologic: Grossly normal  Lab Results:  Basename 12/07/10 0610 12/06/10 0600  WBC 7.2 5.8  HGB 12.8 13.0  HCT 38.1 38.3  PLT 138* 137*   BMET  Basename 12/07/10 0610 12/06/10 0600  NA 136 138  K 4.2 3.8  CL 101 103  CO2 32 29  GLUCOSE 135* 122*  BUN 16 20  CREATININE 0.54 0.52  CALCIUM 8.8 8.7    Studies/Results: Dg Chest 2 View  12/08/2010  *RADIOLOGY REPORT*  Clinical Data: Rule out infiltrate  CHEST - 2 VIEW  Comparison: 12/03/2010   Findings: Cardiomediastinal silhouette is stable.  Mild hyperinflation again noted.  Atherosclerotic calcifications of thoracic aorta.  Mild degenerative changes thoracic spine.  No acute infiltrate or pleural effusion.  No pulmonary edema.  IMPRESSION: No active disease.  Mild hyperinflation again noted.  Mild degenerative changes thoracic spine.  Original Report Authenticated By: Natasha Mead, M.D.    Medications: I have reviewed the patient's current medications.  Assessment/Plan:  1. COPD exacerbation/ Acute and chronic respiratory failure with hypoxia:Slowly improving. Reduced IV steroids further. Add Tussionex for coughing. Continue Avelox and Mucinex. 2. Ongoing tobacco abuse: Cessation counseled. 3. Episode of nonsustained ventricular tachycardia:: Echo shows left ventricle ejection fraction 50-55% with no left ventricular wall motion abnormalities. This is probably precipitated by acute lung issues. Unfortunately cannot start beta blockers secondary to her ongoing COPD exacerbation. Monitor on telemetry. Recent stress test apparently negative. 4. Hypotension: Resolved 5. Thickened distal esophagus:On BID Prilosec at home-likely the result of reflux espohagitis-EGD will be recommended in the outpt setting, unless severe coughing persists despite improvement in COPD, in which case we may need to investigate this finding prior to D/C 6. Thrombocytopenia:Etiology unclear-improving-follow 7. Pulmonary nodules:Follow up CT in 6 months. High risk for cancer given known tobacco abuse. 8. Full code   Disposition: Possible discharge in the next 24-48 hours.    LOS: 5 days  12/08/2010, 4:30 PM  [facility]  [facility]

## 2010-12-08 NOTE — Progress Notes (Signed)
Physical Therapy Evaluation Patient Details Name: Kaitlyn Snyder MRN: [id number] DOB: December 16, 1935 Today's Date: 12/08/2010  Problem List:  Patient Active Problem List  Diagnoses  . Hypotension, unspecified  . Shortness of breath  . Cardiac enzymes elevated  . Pulmonary nodules  . Elevated brain natriuretic peptide (BNP) level  . COPD exacerbation  . Bronchitis, acute, with bronchospasm  . Rhabdomyolysis  . Acute and chronic respiratory failure with hypoxia    Past Medical History:  Past Medical History  Diagnosis Date  . COPD (chronic obstructive pulmonary disease)   . Hyperlipidemia   . Depression   . Squamous carcinoma   . Anxiety    Past Surgical History:  Past Surgical History  Procedure Date  . Abdominal hysterectomy   . Cholecystectomy     PT Assessment/Plan/Recommendation PT Assessment Clinical Impression Statement: Pt independent with mobility.  Ready for dc home from PT standpoint. PT Recommendation/Assessment: Patent does not need any further PT services No Skilled PT: Patient is independent with all acitivity/mobility PT Recommendation Follow Up Recommendations: None Equipment Recommended: None recommended by PT PT Goals     PT Evaluation Precautions/Restrictions    Prior Functioning  Home Living Lives With: Alone Home Layout: One level Prior Function Level of Independence: Independent with basic ADLs;Independent with homemaking with ambulation;Independent with gait;Independent with transfers Driving: Yes Vocation: Part time employment Cognition Cognition Arousal/Alertness: Awake/alert Overall Cognitive Status: Appears within functional limits for tasks assessed Sensation/Coordination   Extremity Assessment RLE Assessment RLE Assessment: Within Functional Limits LLE Assessment LLE Assessment: Within Functional Limits Mobility (including Balance) Bed Mobility Bed Mobility: Yes Supine to Sit: 7: Independent Sit to Supine - Right: 7:  Independent Transfers Transfers: Yes Sit to Stand: 7: Independent Stand to Sit: 7: Independent Ambulation/Gait Ambulation/Gait: Yes Ambulation/Gait Assistance: 7: Independent Ambulation Distance (Feet): 200 Feet Gait Pattern: Within Functional Limits    Exercise    End of Session PT - End of Session Activity Tolerance: Patient tolerated treatment well Patient left: in bed;with call bell in reach Nurse Communication: Mobility status for ambulation General Behavior During Session: [facility] for tasks performed Cognition: [facility] for tasks performed  [facility] 12/08/2010, 10:53 AM [facility] PT [phone]

## 2010-12-08 NOTE — Progress Notes (Signed)
12/08/10 NSG 1330 Informed by NS/MT that pt. Had a 9 beat run of V-tach.  Pt. Resting in bed with no symptoms.  VS 136/68, 86, 18, 97% on 3L.  Dr. Betti Cruz texted paged.  Will continue to monitor and await return response.  Forbes Cellar, RN

## 2010-12-09 LAB — CBC
MCV: 97.2 fL (ref 78.0–100.0)
Platelets: 137 10*3/uL — ABNORMAL LOW (ref 150–400)
RBC: 3.53 MIL/uL — ABNORMAL LOW (ref 3.87–5.11)
WBC: 9 10*3/uL (ref 4.0–10.5)

## 2010-12-09 NOTE — Progress Notes (Signed)
Subjective: Mild dyspnea. She says she coughs when it gets hot in the room.  Objective: Vital signs in last 24 hours: Temp:  [97.7 F (36.5 C)-97.8 F (36.6 C)] 97.7 F (36.5 C) (12/04 0500) Pulse Rate:  [79-85] 79  (12/04 0500) Resp:  [22-23] 22  (12/04 0500) BP: (126-145)/(65-73) 126/73 mmHg (12/04 0500) SpO2:  [87 %-97 %] 93 % (12/04 1455) Weight change:  Last BM Date: 12/07/10  Intake/Output from previous day: 12/03 0701 - 12/04 0700 In: 480 [P.O.:480] Out: -  Intake/Output this shift: Total I/O In: 240 [P.O.:240] Out: -   General appearance: alert, cooperative, dry hacking cough which seems less than yesterday P..  Resp: Distant breath sounds but breath sounds heard in all lung fields. Air entry seems to be better than yesterday. Occasional rhonchi posteriorly. Minimally increased work of breathing when talking. Cardio: First and second heart sounds heard, regular. Telemetry shows sinus rhythm mostly in the 60 to 90s. No further wide complex tachycardias. GI: soft, non-tender; bowel sounds normal; no masses,  no organomegaly Extremities: No significant cyanosis clubbing or edema bilateral lower extremities Neurologic: Grossly normal  Lab Results:  [facility] 12/09/10 0615 12/07/10 0610  WBC 9.0 7.2  HGB 11.7* 12.8  HCT 34.3* 38.1  PLT 137* 138*   BMET  Basename 12/07/10 0610  NA 136  K 4.2  CL 101  CO2 32  GLUCOSE 135*  BUN 16  CREATININE 0.54  CALCIUM 8.8    Studies/Results: Dg Chest 2 View  12/08/2010  *RADIOLOGY REPORT*  Clinical Data: Rule out infiltrate  CHEST - 2 VIEW  Comparison: 12/03/2010  Findings: Cardiomediastinal silhouette is stable.  Mild hyperinflation again noted.  Atherosclerotic calcifications of thoracic aorta.  Mild degenerative changes thoracic spine.  No acute infiltrate or pleural effusion.  No pulmonary edema.  IMPRESSION: No active disease.  Mild hyperinflation again noted.  Mild degenerative changes thoracic spine.  Original Report  Authenticated By: Natasha Mead, M.D.    Medications: I have reviewed the patient's current medications.  Assessment/Plan:  1. COPD exacerbation/ Acute and chronic respiratory failure with hypoxia:Slowly improving. Continue IV Solu-Medrol. Added Tussionex for coughing. Continue Avelox and Mucinex. 2. Ongoing tobacco abuse: Cessation counseled. 3. Episode of nonsustained ventricular tachycardia on 12/3:: Echo shows left ventricle ejection fraction 50-55% with no left ventricular wall motion abnormalities. This is probably precipitated by acute lung issues. Unfortunately cannot start beta blockers secondary to her ongoing COPD exacerbation. Monitor on telemetry. Recent stress test apparently negative. 4. Hypotension: Resolved 5. Thickened distal esophagus:On BID Prilosec at home-likely the result of reflux espohagitis-EGD will be recommended in the outpt setting, unless severe coughing persists despite improvement in COPD, in which case we may need to investigate this finding prior to D/C 6. Thrombocytopenia:Etiology unclear-improving-follow. Stable 7. Pulmonary nodules:Follow up CT in 6 months. High risk for cancer given known tobacco abuse. 8. Full code   Disposition: Not yet stable for discharge.    LOS: 6 days  12/09/2010, 3:55 PM  Kaitlyn Snyder  [facility]

## 2010-12-09 NOTE — Progress Notes (Signed)
Utilization Review Completed.Kaitlyn Snyder T12/04/2010   

## 2010-12-10 LAB — CULTURE, BLOOD (ROUTINE X 2)
Culture  Setup Time: 201211290847
Culture: NO GROWTH

## 2010-12-10 LAB — CREATININE, SERUM: GFR calc Af Amer: >90 mL/min (ref >90)

## 2010-12-10 MED ORDER — PREDNISONE 50 MG PO TABS
50.0000 mg | ORAL_TABLET | Freq: Every day | ORAL | Status: DC
  Administered 2010-12-11 – 2010-12-12 (×2): 50 mg via ORAL
  Filled 2010-12-10 (×4): qty 1

## 2010-12-10 NOTE — Progress Notes (Signed)
Subjective: Patient feels her breathing to be improving . No overnight issues  Objective:  Vital signs in last 24 hours:  Filed Vitals:   12/10/10 0446 12/10/10 0916 12/10/10 1431 12/10/10 1542  BP: 128/72  149/71   Pulse: 84  93   Temp: 98.4 F (36.9 C)  97.8 F (36.6 C)   TempSrc: Oral  Oral   Resp: 20  20   Height:      Weight:      SpO2: 93% 93% 97% 93%    Intake/Output from previous day:   Intake/Output Summary (Last 24 hours) at 12/10/10 1828 Last data filed at 12/10/10 1300  Gross per 24 hour  Intake    480 ml  Output      0 ml  Net    480 ml    Physical Exam:  General:elderly thin built female  in no acute distress. HEENT: no pallor, no icterus, moist oral mucosa, no JVD, no lymphadenopathy Heart: Normal  s1 &s2  Regular rate and rhythm, without murmurs, rubs, gallops. Lungs: diffuse ronchi b/l Abdomen: Soft, nontender, nondistended, positive bowel sounds. Extremities: No clubbing cyanosis or edema with positive pedal pulses. Neuro: Alert, awake, oriented x3, nonfocal.   Lab Results:  Basic Metabolic Panel:    Component Value Date/Time   NA 136 12/07/2010 0610   K 4.2 12/07/2010 0610   CL 101 12/07/2010 0610   CO2 32 12/07/2010 0610   BUN 16 12/07/2010 0610   CREATININE 0.52 12/10/2010 0550   GLUCOSE 135* 12/07/2010 0610   CALCIUM 8.8 12/07/2010 0610   CBC:    Component Value Date/Time   WBC 9.0 12/09/2010 0615   HGB 11.7* 12/09/2010 0615   HCT 34.3* 12/09/2010 0615   PLT 137* 12/09/2010 0615   MCV 97.2 12/09/2010 0615   NEUTROABS 4.8 12/06/2010 0600   LYMPHSABS 0.7 12/06/2010 0600   MONOABS 0.3 12/06/2010 0600   EOSABS 0.0 12/06/2010 0600   BASOSABS 0.0 12/06/2010 0600    Recent Results (from the past 240 hour(s))  MRSA PCR SCREENING     Status: Normal   Collection Time   12/03/10  7:24 PM      Component Value Range Status Comment   MRSA by PCR NEGATIVE  NEGATIVE  Final   CULTURE, BLOOD (ROUTINE X 2)     Status: Normal   Collection Time   12/03/10 11:20 PM      Component Value Range Status Comment   Specimen Description BLOOD RIGHT ARM   Final    Special Requests BOTTLES DRAWN AEROBIC AND ANAEROBIC [facility]   Final    Setup Time [id number]   Final    Culture NO GROWTH 5 DAYS   Final    Report Status 12/10/2010 FINAL   Final   CULTURE, BLOOD (ROUTINE X 2)     Status: Normal   Collection Time   12/03/10 11:30 PM      Component Value Range Status Comment   Specimen Description BLOOD RIGHT HAND   Final    Special Requests BOTTLES DRAWN AEROBIC ONLY 10CC   Final    Setup Time [id number]   Final    Culture NO GROWTH 5 DAYS   Final    Report Status 12/10/2010 FINAL   Final   URINE CULTURE     Status: Normal   Collection Time   12/04/10  4:30 AM      Component Value Range Status Comment   Specimen Description URINE, CLEAN CATCH   Final  Special Requests Normal   Final    Setup Time [id number]   Final    Colony Count NO GROWTH   Final    Culture NO GROWTH   Final    Report Status 12/05/2010 FINAL   Final     Studies/Results: No results found.  Medications: Scheduled Meds:   . antiseptic oral rinse  15 mL Mouth Rinse BID  . aspirin  81 mg Oral Daily  . calcium-vitamin D  1 tablet Oral BID  . enoxaparin  40 mg Subcutaneous Q24H  . FLUoxetine  20 mg Oral Daily  . Fluticasone-Salmeterol  1 puff Inhalation BID  . guaiFENesin  1,200 mg Oral BID  . ipratropium  0.5 mg Nebulization Q6H  . levalbuterol  0.63 mg Nebulization Q6H  . methylPREDNISolone (SOLU-MEDROL) injection  40 mg Intravenous TID  . moxifloxacin  400 mg Oral Q2000  . pantoprazole  40 mg Oral Q1200  . simvastatin  40 mg Oral QHS  . cyanocobalamin  2,000 mcg Oral Daily   Continuous Infusions:  PRN Meds:.acetaminophen, acetaminophen, alum & mag hydroxide-simeth, chlorpheniramine-HYDROcodone, guaiFENesin-dextromethorphan, HYDROcodone-acetaminophen, morphine injection, nitroGLYCERIN, ondansetron (ZOFRAN) IV, ondansetron,  senna-docusate  Assessment 75 y/o female with hx of COPD ( on home 02 but inconsistent), depression, hyperlipidemia, anxiety admitted with COPD exacerbation with slow improvement.   Plan:  acute on chr resp failure sec to COPD exacerbation   slowly improving Switch to po prednisone cont   Nebs - Continue Avelox and Mucinex.  -echo done on 11/28 showing low normal EF ( 50-55% ) with gr 1 diastolic dysfn  Hypotension: on admission,Resolved.   Thickened distal esophagus:On BID Prilosec at home-? reflux espohagitis- EGD as outpt   Stable Pulmonary nodules:Follow up CT in 6 months.   Thrombocytopenia  unclear etiology, stable at present    Ongoing tobacco abuse: counseled on cessation   Dispo: home on po meds if stable in 1 -2 days   LOS: 7 days   Deloras Reichard 12/10/2010, 6:28 PM

## 2010-12-10 NOTE — Progress Notes (Signed)
Upon assessment pt IV site had dried blood underneath the transparent dressing.  When returned to room, IV had been pulled out while pt was in bed.  x2 RNs assessed for new IV, one unsuccessful attempt was made.  IV team was called and started new IV.  Will continue to monitor.  Angelique Blonder, RN

## 2010-12-11 NOTE — Progress Notes (Addendum)
Subjective: Patient seen and examined this morning. Feels her breathing to be getting better but not back to normal  Objective:  Vital signs in last 24 hours:  Filed Vitals:   12/11/10 0305 12/11/10 0825 12/11/10 1403 12/11/10 1421  BP:   124/64   Pulse:   98   Temp:   98.4 F (36.9 C)   TempSrc:   Oral   Resp:   20   Height:      Weight:      SpO2: 96% 95% 94% 95%    Intake/Output from previous day:   Intake/Output Summary (Last 24 hours) at 12/11/10 1558 Last data filed at 12/11/10 1403  Gross per 24 hour  Intake    720 ml  Output      0 ml  Net    720 ml    Physical Exam:  General:elderly thin built female in no acute distress.  HEENT: no pallor, no icterus, moist oral mucosa, no JVD, no lymphadenopathy  Heart: Normal s1 &s2 Regular rate and rhythm, without murmurs, rubs, gallops.  Lungs: diffuse ronchi b/l  Abdomen: Soft, nontender, nondistended, positive bowel sounds.  Extremities: No clubbing cyanosis or edema with positive pedal pulses.  Neuro: Alert, awake, oriented x3, nonfocal.  Lab Results:  Basic Metabolic Panel:    Component Value Date/Time   NA 136 12/07/2010 0610   K 4.2 12/07/2010 0610   CL 101 12/07/2010 0610   CO2 32 12/07/2010 0610   BUN 16 12/07/2010 0610   CREATININE 0.52 12/10/2010 0550   GLUCOSE 135* 12/07/2010 0610   CALCIUM 8.8 12/07/2010 0610   CBC:    Component Value Date/Time   WBC 9.0 12/09/2010 0615   HGB 11.7* 12/09/2010 0615   HCT 34.3* 12/09/2010 0615   PLT 137* 12/09/2010 0615   MCV 97.2 12/09/2010 0615   NEUTROABS 4.8 12/06/2010 0600   LYMPHSABS 0.7 12/06/2010 0600   MONOABS 0.3 12/06/2010 0600   EOSABS 0.0 12/06/2010 0600   BASOSABS 0.0 12/06/2010 0600    Recent Results (from the past 240 hour(s))  MRSA PCR SCREENING     Status: Normal   Collection Time   12/03/10  7:24 PM      Component Value Range Status Comment   MRSA by PCR NEGATIVE  NEGATIVE  Final   CULTURE, BLOOD (ROUTINE X 2)     Status: Normal   Collection Time   12/03/10 11:20 PM      Component Value Range Status Comment   Specimen Description BLOOD RIGHT ARM   Final    Special Requests BOTTLES DRAWN AEROBIC AND ANAEROBIC [facility]   Final    Setup Time [id number]   Final    Culture NO GROWTH 5 DAYS   Final    Report Status 12/10/2010 FINAL   Final   CULTURE, BLOOD (ROUTINE X 2)     Status: Normal   Collection Time   12/03/10 11:30 PM      Component Value Range Status Comment   Specimen Description BLOOD RIGHT HAND   Final    Special Requests BOTTLES DRAWN AEROBIC ONLY 10CC   Final    Setup Time [id number]   Final    Culture NO GROWTH 5 DAYS   Final    Report Status 12/10/2010 FINAL   Final   URINE CULTURE     Status: Normal   Collection Time   12/04/10  4:30 AM      Component Value Range Status Comment   Specimen Description  URINE, CLEAN CATCH   Final    Special Requests Normal   Final    Setup Time [phone]   Final    Colony Count NO GROWTH   Final    Culture NO GROWTH   Final    Report Status 12/05/2010 FINAL   Final     Studies/Results: No results found.  Medications: Scheduled Meds:   . antiseptic oral rinse  15 mL Mouth Rinse BID  . aspirin  81 mg Oral Daily  . calcium-vitamin D  1 tablet Oral BID  . enoxaparin  40 mg Subcutaneous Q24H  . FLUoxetine  20 mg Oral Daily  . Fluticasone-Salmeterol  1 puff Inhalation BID  . guaiFENesin  1,200 mg Oral BID  . ipratropium  0.5 mg Nebulization Q6H  . levalbuterol  0.63 mg Nebulization Q6H  . moxifloxacin  400 mg Oral Q2000  . pantoprazole  40 mg Oral Q1200  . predniSONE  50 mg Oral Q breakfast  . simvastatin  40 mg Oral QHS  . cyanocobalamin  2,000 mcg Oral Daily  . DISCONTD: methylPREDNISolone (SOLU-MEDROL) injection  40 mg Intravenous TID   Continuous Infusions:  PRN Meds:.acetaminophen, acetaminophen, alum & mag hydroxide-simeth, chlorpheniramine-HYDROcodone, guaiFENesin-dextromethorphan, HYDROcodone-acetaminophen, morphine injection, nitroGLYCERIN, ondansetron  (ZOFRAN) IV, ondansetron, senna-docusate  Assessment  75 y/o female with hx of COPD ( on home 02 but inconsistent), depression, hyperlipidemia, anxiety admitted with COPD exacerbation with slow improvement.   Plan:   acute on chr resp failure sec to COPD exacerbation  slowly improving  Switch to po prednisone and cont Nebs . Breathing much better today but still not back to baseline - Continue  Mucinex. dced avelox ( completed 7 days of tretment) -echo done on 11/28 showing low normal EF ( 50-55% ) with gr 1 diastolic dysfn   Hypotension: on admission,Resolved.   Thickened distal esophagus:On BID Prilosec at home -? reflux espohagitis-  EGD as outpt   Stable Pulmonary nodules: Follow up CT in 6 months.   Thrombocytopenia  unclear etiology, stable at present   Ongoing tobacco abuse: counseled on cessation .  Dispo: home on po meds tomorrow if breathing continues to improve  Patient has home o2 but not using consistently. Will provide prn nebs on d/c also would need pulm eval and PFT as outpatient .   LOS: 8 days   Kaitlyn Snyder 12/11/2010, 3:58 PM

## 2010-12-12 MED ORDER — PREDNISONE (PAK) 10 MG PO TABS: 20.0000 mg | ORAL_TABLET | Freq: Every day | ORAL | Status: AC

## 2010-12-12 MED ORDER — ALBUTEROL SULFATE (2.5 MG/3ML) 0.083% IN NEBU: 2.5000 mg | INHALATION_SOLUTION | Freq: Four times a day (QID) | RESPIRATORY_TRACT | Status: DC | PRN

## 2010-12-12 MED ORDER — FLUTICASONE-SALMETEROL 250-50 MCG/DOSE IN AEPB: 1.0000 | INHALATION_SPRAY | Freq: Two times a day (BID) | RESPIRATORY_TRACT | Status: DC

## 2010-12-12 NOTE — Discharge Summary (Signed)
Patient ID: Jaliana Medellin Scarano MRN: [id number] DOB/AGE: [birth year] 75 y.o.  Admit date: 12/03/2010 Discharge date: 12/12/2010  Primary Care Physician:  Gweneth Dimitri, MD  Discharge Diagnoses:    Present on Admission:  .Acute and chronic respiratory failure with hypoxia .COPD exacerbation .Hypotension, unspecified .Pulmonary nodules .Elevated brain natriuretic peptide (BNP) level        Current Discharge Medication List    START taking these medications   Details  albuterol (PROVENTIL) (2.5 MG/3ML) 0.083% nebulizer solution Take 3 mLs (2.5 mg total) by nebulization every 6 (six) hours as needed for wheezing or shortness of breath. Qty: 75 mL, Refills: 3    Fluticasone-Salmeterol (ADVAIR) 250-50 MCG/DOSE AEPB Inhale 1 puff into the lungs 2 (two) times daily. Qty: 14 each, Refills: 0    predniSONE (STERAPRED UNI-PAK) 10 MG tablet Take 2 tablets (20 mg total) by mouth daily. TAKE 2 Tablets ( 40 mg ) po daily for 3 days, then 30 mg daily for next 3 days then 20 mg daily for next 3 days then 10 mg po daily for next 3 days then stop Qty: 16 tablet, Refills: 0      CONTINUE these medications which have NOT CHANGED   Details  albuterol (PROVENTIL HFA;VENTOLIN HFA) 108 (90 BASE) MCG/ACT inhaler Inhale 2 puffs into the lungs every 4 (four) hours as needed.      aspirin 81 MG tablet Take 81 mg by mouth daily.      Calcium Carbonate-Vitamin D (CALTRATE 600+D) 600-400 MG-UNIT per tablet Take 1 tablet by mouth 2 (two) times daily.      cholecalciferol (VITAMIN D) 1000 UNITS tablet Take 4,000 Units by mouth daily.      conjugated estrogens (PREMARIN) vaginal cream Place 1 g vaginally 3 (three) times a week. Use on Monday, Wednesday and Friday     Cyanocobalamin (VITAMIN B 12 PO) Take 2,000 mg by mouth.      estrogens, conjugated, (PREMARIN) 0.625 MG tablet Take 0.625 mg by mouth daily. As directed     FLUoxetine (PROZAC) 20 MG tablet Take 20 mg by mouth daily.      omeprazole  (PRILOSEC) 20 MG capsule Take 20 mg by mouth 2 (two) times daily.      simvastatin (ZOCOR) 40 MG tablet Take 40 mg by mouth at bedtime.      tiotropium (SPIRIVA) 18 MCG inhalation capsule Place 18 mcg into inhaler and inhale daily.      nitroGLYCERIN (NITROSTAT) 0.4 MG SL tablet Place 0.4 mg under the tongue every 5 (five) minutes as needed.        STOP taking these medications     cyanocobalamin 2000 MCG tablet      predniSONE (DELTASONE) 10 MG tablet         Disposition and Follow-up:  Follow up with PCP in 1 week. She needs outpatient PFTs scheduled and also would benefit from outpt pulmonary referral for her COPD management  Consults:  none  Significant Diagnostic Studies:  Ct Angio Chest W/cm &/or Wo Cm  12/03/2010  *RADIOLOGY REPORT*  Clinical Data:  75-year with shortness of breath and tachycardia. Evaluate for pulmonary embolism.  CT ANGIOGRAPHY CHEST WITH CONTRAST  Technique:  Multidetector CT imaging of the chest was performed using the standard protocol during bolus administration of intravenous contrast.  Multiplanar CT image reconstructions including MIPs were obtained to evaluate the vascular anatomy.  Contrast: OMNIPAQUE IOHEXOL 350 MG/ML IV SOLN  Comparison:  Chest radiograph 12/03/2010  Findings:  No evidence  for pulmonary emboli.  There are coronary artery calcifications.  There is pericardial fluid along the anterior aspect of the heart.  No significant pleural fluid.  There may be a small hiatal hernia but there is also concern for distal esophageal wall thickening.   No evidence for mediastinal or hilar lymphadenopathy. There is atherosclerotic disease involving the aortic arch and possible stenosis at the origin of the left subclavian artery.  The trachea and mainstem bronchi are patent.  5 mm pulmonary nodule in the left lower lobe on image 67.  6 mm nodule in the left lower lobe on image 61 and there is a peripheral 6 mm nodule in the posterior left lower lobe  on image 63.  There are emphysematous changes in the upper lungs.  A few peripheral nodular densities in the anterior right middle lobe on image 58.  These right middle lobe nodules could be post inflammatory in etiology.  No acute bony abnormality.  Review of the MIP images confirms the above findings.  IMPRESSION: No evidence for a pulmonary embolism.  Concern for wall thickening of the distal esophagus.  These findings are nonspecific and could be further evaluated with an esophagram or endoscopy.  There may be a small hiatal hernia.  Emphysematous changes with a few bilateral pulmonary nodules.  The largest pulmonary nodule measures 6 mm in the left lower lobe. These nodules are indeterminate.  If the patient is at high risk for bronchogenic carcinoma, follow-up chest CT at 6-12 months is recommended.  If the patient is at low risk for bronchogenic carcinoma, follow-up chest CT at 12 months is recommended.  This recommendation follows the consensus statement: Guidelines for Management of Small Pulmonary Nodules Detected on CT Scans: A Statement from the Fleischner Society as published in Radiology 2005; 237:395-400. Online at: DietDisorder.cz.  Coronary artery calcifications.  Small amount of pericardial fluid.  Original Report Authenticated By: Richarda Overlie, M.D.   Dg Chest Portable 1 View  12/03/2010  *RADIOLOGY REPORT*  Clinical Data: Fever, cough, shortness of breath  PORTABLE CHEST - 1 VIEW  Comparison: 12/18/2009  Findings: Heart size is mildly enlarged.  Hyperinflation and prompt interstitial markings are compatible with COPD. Possible focal consolidation is noted over the right lower lobe projecting over the right cardiophrenic angle.  No pleural effusion.  IMPRESSION: Possible right lower lobe airspace disease.  Recommend PA and and lateral chest radiographs obtained at full inspiration when the patient is clinically able.  Original Report Authenticated By:  Harrel Lemon, M.D.    Brief H and P: For complete details please refer to admission H and P, but in brief Dinna Severs Ketcham is a 75 y.o. female with past medical history of COPD (chronic obstructive pulmonary disease); Hyperlipidemia; Depression; Squamous carcinoma; and Anxiety.  Presented with For the past 24 hours she has experienced increased work of breathing increased shortness of breath she hadn't had some cough productive of green mucus. She had low-grade temperature to 100.1. Patient has been on prednisone since October for ongoing COPD. She continues to smoke about a pack a day. She had not any chest pain. When she first presented to [facility] emergency department she was living 40 times a minute and were wheezing extensively. She was given Solu-Medrol given albuterol and Atrovent and put on BiPAP. After her transfer to [facility] step down she improved and currently wishes to BE taken off BiPAP she is feeling better   Physical Exam on Discharge:  Filed Vitals:   12/11/10  2152 12/12/10 0533 12/12/10 0812 12/12/10 0900  BP: 137/75 129/70  107/55  Pulse: 88 93  113  Temp: 97.8 F (36.6 C) 98.3 F (36.8 C)  97.6 F (36.4 C)  TempSrc: Oral Oral  Oral  Resp: 22 21  18   Height:      Weight:      SpO2: 97% 96% 95% 98%     Intake/Output Summary (Last 24 hours) at 12/12/10 1216 Last data filed at 12/12/10 0900  Gross per 24 hour  Intake    900 ml  Output      0 ml  Net    900 ml    General: elderly thin built female  in no acute distress. HEENT: No bruits, no goiter.no pallor, no icterus, moist oral mucosa,  Chest :regular rate and rhythm, without murmurs, rubs, gallops. Lungs: Clear to auscultation bilaterally. Abdomen: Soft, nontender, nondistended, positive bowel sounds. Extremities: No clubbing cyanosis or edema with positive pedal pulses. Neuro: AAOX 3 ,Grossly intact, nonfocal.  CBC:    Component Value Date/Time   WBC 9.0 12/09/2010 0615   HGB 11.7* 12/09/2010  0615   HCT 34.3* 12/09/2010 0615   PLT 137* 12/09/2010 0615   MCV 97.2 12/09/2010 0615   NEUTROABS 4.8 12/06/2010 0600   LYMPHSABS 0.7 12/06/2010 0600   MONOABS 0.3 12/06/2010 0600   EOSABS 0.0 12/06/2010 0600   BASOSABS 0.0 12/06/2010 0600    Basic Metabolic Panel:    Component Value Date/Time   NA 136 12/07/2010 0610   K 4.2 12/07/2010 0610   CL 101 12/07/2010 0610   CO2 32 12/07/2010 0610   BUN 16 12/07/2010 0610   CREATININE 0.52 12/10/2010 0550   GLUCOSE 135* 12/07/2010 0610   CALCIUM 8.8 12/07/2010 0610    Hospital Course:  acute on chr resp failure sec to COPD exacerbation  Patient admitted with acute on chr resp failure due to her underlying COPD and placed on BiPAP with improvement. She was started on IV solumedrol and nebs with slow improvement. Switch to po prednisone and cont Nebs . Breathing much better today  back to baseline  completed 1 week course of avelox  -echo done on 11/28 showing low normal EF ( 50-55% ) with gr 1 diastolic dysfn   Hypotension:  on admission,Resolved.   Thickened distal esophagus: On BID Prilosec at home  -? reflux espohagitis- EGD as outpt   Stable Pulmonary nodules ( b/l) on CT angio done on 11/28  Follow up CT in 6 months given high risk with active smoking.  Thrombocytopenia  unclear etiology, stable at present   Ongoing tobacco abuse:  counseled on cessation . Patient informs having nicotine patches at home and plans to quit  [facility] set up for providing nebs at home and monitoring medications for COPD. She has  home o2 ansd encouraged to use it continuously. She will need PFTs as outpatient ( patient denies having one) and pulmonary referral. Dispo: home on po meds tomorrow if breathing continues to improve  Patient has home o2 but not using consistently. Will provide prn nebs on d/c also would need pulm eval and PFT as outpatient .      Time spent on Discharge: 45 minutes  Signed: Eddie North 12/12/2010, 12:16  PM

## 2010-12-12 NOTE — Progress Notes (Signed)
12/12/2010 [facility] Case Management Note [phone]         CARE MANAGEMENT NOTE 12/12/2010  Patient:  Kaitlyn Snyder, Kaitlyn Snyder   Account Number:  [id number]  Date Initiated:  12/04/2010  Documentation initiated by:  Onnie Boer  Subjective/Objective Assessment:   PT WAS ADMITTED WITH COPD EXACERBATION.Lives alone, still works, has family in [place]. Has home O2 which she uses prn, with [facility]     Action/Plan:   PROGRESSION OF CARE AND DISCHARGE PLANNING   Anticipated DC Date:  12/10/2010   Anticipated DC Plan:  HOME/SELF CARE      DC Planning Services  CM consult      Choice offered to / List presented to:             Status of service:  In process, will continue to follow Medicare Important Message given?   (If response is "NO", the following Medicare IM given date fields will be blank) Date Medicare IM given:   Date Additional Medicare IM given:    Discharge Disposition:  HOME/SELF CARE  Per UR Regulation:  Reviewed for med. necessity/level of care/duration of stay  Comments:  12/12/10-1117- Kaitlyn Eschmann,RN,BSN [phone]     Spoke with Kaitlyn Lias, RN with [facility] regarding setting patient up in the COPD program with [facility]. Patient is an active patient with home O2. No further discharge needs identified.  12/08/10 Spoke with patient about d/c needs, feels that she is about at baseline which is independent with ambulation and ADLs. SHe has a walker and BSC from family member.Adult children live in [place] and are availbale prn. Kaitlyn Cree RN, BSN, CM   12/04/2010 Onnie Boer, RN, BSN 1542 UR COMPLETED, PT CONTS TO SMOKE.   SHE WORKS 2 JOBS AND HAS PRN O2 FROM [facility].  WILL F/U ON [facility] NEEDS

## 2010-12-24 ENCOUNTER — Encounter: Payer: Self-pay | Admitting: Pulmonary Disease

## 2010-12-24 ENCOUNTER — Ambulatory Visit (INDEPENDENT_AMBULATORY_CARE_PROVIDER_SITE_OTHER): Payer: Medicare Other | Admitting: Pulmonary Disease

## 2010-12-24 DIAGNOSIS — J441 Chronic obstructive pulmonary disease with (acute) exacerbation: Secondary | ICD-10-CM

## 2010-12-24 DIAGNOSIS — R0902 Hypoxemia: Secondary | ICD-10-CM

## 2010-12-24 DIAGNOSIS — J9611 Chronic respiratory failure with hypoxia: Secondary | ICD-10-CM | POA: Insufficient documentation

## 2010-12-24 DIAGNOSIS — R918 Other nonspecific abnormal finding of lung field: Secondary | ICD-10-CM

## 2010-12-24 DIAGNOSIS — J439 Emphysema, unspecified: Secondary | ICD-10-CM | POA: Insufficient documentation

## 2010-12-24 DIAGNOSIS — J4489 Other specified chronic obstructive pulmonary disease: Secondary | ICD-10-CM

## 2010-12-24 DIAGNOSIS — J449 Chronic obstructive pulmonary disease, unspecified: Secondary | ICD-10-CM

## 2010-12-24 NOTE — Progress Notes (Deleted)
  Subjective:    Patient ID: Kaitlyn Snyder, female    DOB: 04-19-1935, 75 y.o.   MRN: [id number]  HPI    Review of Systems  HENT: Positive for congestion.   Respiratory: Positive for shortness of breath.        Objective:   Physical Exam        Assessment & Plan:

## 2010-12-24 NOTE — Assessment & Plan Note (Signed)
Continue spiriva, advair, prn albuterol Continue oxygen at night Will need f/u ct chest in 6 months for nodules>>ordered through pcp

## 2010-12-24 NOTE — Patient Instructions (Signed)
Continue spiriva and advair Follow up in 6 months

## 2010-12-24 NOTE — Assessment & Plan Note (Signed)
She is to continue with supplemental oxygen at night.  Will re-assess her home oxygen needs at next visit.

## 2010-12-24 NOTE — Progress Notes (Signed)
Chief Complaint  Patient presents with  . Pulmonary Consult    Referred by Dr. Gweneth Dimitri for COPD. Denies sob, wheezing, chest pain, and chest tightness.     History of Present Illness: Kaitlyn Snyder is a 75 y.o. female former smoker for evaluation of COPD.  She has noticed trouble with her breathing for several years.  She has been getting frequent episodes of bronchitis, and in the hospital frequently.  She was discharged from [facility] last week.  She has been told she has COPD, and she was told she has emphysema on her chest scan.  As a result she was set up for pulmonary evaluation.  She still has a cough with chest congestion.  She is bringing up clear to green sputum.  These have been getting better since she got out of the hospital.  She denies hemoptysis.  She finished her prednisone and antibiotics from the hospital.  She quit smoking on November 28.  She smoked up to 1 pack per day since age 49.  There is no prior history of pneumonia or tuberculosis.  She has never been told she has allergies or asthma.  She has been using spiriva and advair for years.  She was recently started on albuterol.  She uses this twice per day, and this helps.  She has been using oxygen at 2.5 liters at night.    She has not had recent breathing tests done.  Past Medical History  Diagnosis Date  . COPD (chronic obstructive pulmonary disease)   . Hyperlipidemia   . Depression   . Squamous carcinoma   . Anxiety   . Anemia, B12 deficiency     Past Surgical History  Procedure Date  . Abdominal hysterectomy   . Cholecystectomy   . Gallbladder surgery     Current Outpatient Prescriptions on File Prior to Visit  Medication Sig Dispense Refill  . albuterol (PROVENTIL HFA;VENTOLIN HFA) 108 (90 BASE) MCG/ACT inhaler Inhale 2 puffs into the lungs every 4 (four) hours as needed.        Marland Kitchen albuterol (PROVENTIL) (2.5 MG/3ML) 0.083% nebulizer solution Take 3 mLs (2.5 mg total) by  nebulization every 6 (six) hours as needed for wheezing or shortness of breath.  75 mL  3  . aspirin 81 MG tablet Take 81 mg by mouth daily.        . Calcium Carbonate-Vitamin D (CALTRATE 600+D) 600-400 MG-UNIT per tablet Take 1 tablet by mouth 2 (two) times daily.        . cholecalciferol (VITAMIN D) 1000 UNITS tablet Take 4,000 Units by mouth daily.        . Cyanocobalamin (VITAMIN B 12 PO) Take 2,000 mg by mouth.        Marland Kitchen FLUoxetine (PROZAC) 20 MG tablet Take 20 mg by mouth daily.        . nitroGLYCERIN (NITROSTAT) 0.4 MG SL tablet Place 0.4 mg under the tongue every 5 (five) minutes as needed.        Marland Kitchen omeprazole (PRILOSEC) 20 MG capsule Take 20 mg by mouth 2 (two) times daily.        . simvastatin (ZOCOR) 40 MG tablet Take 40 mg by mouth at bedtime.        Marland Kitchen tiotropium (SPIRIVA) 18 MCG inhalation capsule Place 18 mcg into inhaler and inhale daily.          No Known Allergies  family history includes Emphysema in her father and Kidney failure in her  brother.   reports that she quit smoking about 3 weeks ago. Her smoking use included Cigarettes. She has a 27.5 pack-year smoking history. She has never used smokeless tobacco. She reports that she drinks about 3.6 ounces of alcohol per week. She reports that she does not use illicit drugs.  Review of Systems Negative except above.  Physical Exam: BP 138/72  Pulse 109  Temp(Src) 97.2 F (36.2 C) (Oral)  Wt 133 lb 6.4 oz (60.51 kg)  SpO2 96% There is no height on file to calculate BMI.  General - Thin HEENT - PERRLA, EOMI, no sinus tenderness, no oral exudate, no LAN, no thyromegaly Cardiac - s1s2 regular, no murmur Chest - prolonged exhalation, no wheeze/rales/dullness Abdomen - soft, nontender, no organomegaly Extremities - no e/c/c Neurologic - normal strength, CN intact Skin - no rashes Psychiatric - normal mood, behavior  Spirometry 12/24/10>>FEV1 0.89, FEV1% 45  Ct Angio Chest W/cm &/or Wo Cm  12/03/2010  *RADIOLOGY  REPORT*  Clinical Data:  74-year with shortness of breath and tachycardia. Evaluate for pulmonary embolism.  CT ANGIOGRAPHY CHEST WITH CONTRAST  Technique:  Multidetector CT imaging of the chest was performed using the standard protocol during bolus administration of intravenous contrast.  Multiplanar CT image reconstructions including MIPs were obtained to evaluate the vascular anatomy.  Contrast: OMNIPAQUE IOHEXOL 350 MG/ML IV SOLN  Comparison:  Chest radiograph 12/03/2010  Findings:  No evidence for pulmonary emboli.  There are coronary artery calcifications.  There is pericardial fluid along the anterior aspect of the heart.  No significant pleural fluid.  There may be a small hiatal hernia but there is also concern for distal esophageal wall thickening.   No evidence for mediastinal or hilar lymphadenopathy. There is atherosclerotic disease involving the aortic arch and possible stenosis at the origin of the left subclavian artery.  The trachea and mainstem bronchi are patent.  5 mm pulmonary nodule in the left lower lobe on image 67.  6 mm nodule in the left lower lobe on image 61 and there is a peripheral 6 mm nodule in the posterior left lower lobe on image 63.  There are emphysematous changes in the upper lungs.  A few peripheral nodular densities in the anterior right middle lobe on image 58.  These right middle lobe nodules could be post inflammatory in etiology.  No acute bony abnormality.  Review of the MIP images confirms the above findings.  IMPRESSION: No evidence for a pulmonary embolism.  Concern for wall thickening of the distal esophagus.  These findings are nonspecific and could be further evaluated with an esophagram or endoscopy.  There may be a small hiatal hernia.  Emphysematous changes with a few bilateral pulmonary nodules.  The largest pulmonary nodule measures 6 mm in the left lower lobe. These nodules are indeterminate.  If the patient is at high risk for bronchogenic carcinoma,  follow-up chest CT at 6-12 months is recommended.  If the patient is at low risk for bronchogenic carcinoma, follow-up chest CT at 12 months is recommended.  This recommendation follows the consensus statement: Guidelines for Management of Small Pulmonary Nodules Detected on CT Scans: A Statement from the Fleischner Society as published in Radiology 2005; 237:395-400. Online at: DietDisorder.cz.  Coronary artery calcifications.  Small amount of pericardial fluid.  Original Report Authenticated By: Richarda Overlie, M.D.   Dg Chest Portable 1 View  12/03/2010  *RADIOLOGY REPORT*  Clinical Data: Fever, cough, shortness of breath  PORTABLE CHEST - 1 VIEW  Comparison: 12/18/2009  Findings: Heart size is mildly enlarged.  Hyperinflation and prompt interstitial markings are compatible with COPD. Possible focal consolidation is noted over the right lower lobe projecting over the right cardiophrenic angle.  No pleural effusion.  IMPRESSION: Possible right lower lobe airspace disease.  Recommend PA and and lateral chest radiographs obtained at full inspiration when the patient is clinically able.  Original Report Authenticated By: Harrel Lemon, M.D.   Assessment/Plan:  Outpatient Encounter Prescriptions as of 12/24/2010  Medication Sig Dispense Refill  . albuterol (PROVENTIL HFA;VENTOLIN HFA) 108 (90 BASE) MCG/ACT inhaler Inhale 2 puffs into the lungs every 4 (four) hours as needed.        Marland Kitchen albuterol (PROVENTIL) (2.5 MG/3ML) 0.083% nebulizer solution Take 3 mLs (2.5 mg total) by nebulization every 6 (six) hours as needed for wheezing or shortness of breath.  75 mL  3  . aspirin 81 MG tablet Take 81 mg by mouth daily.        . Calcium Carbonate-Vitamin D (CALTRATE 600+D) 600-400 MG-UNIT per tablet Take 1 tablet by mouth 2 (two) times daily.        . cholecalciferol (VITAMIN D) 1000 UNITS tablet Take 4,000 Units by mouth daily.        . Cyanocobalamin (VITAMIN B 12 PO) Take  2,000 mg by mouth.        Marland Kitchen FLUoxetine (PROZAC) 20 MG tablet Take 20 mg by mouth daily.        . fluticasone-salmeterol (ADVAIR HFA) 115-21 MCG/ACT inhaler Inhale 2 puffs into the lungs 2 (two) times daily.        . nitroGLYCERIN (NITROSTAT) 0.4 MG SL tablet Place 0.4 mg under the tongue every 5 (five) minutes as needed.        Marland Kitchen omeprazole (PRILOSEC) 20 MG capsule Take 20 mg by mouth 2 (two) times daily.        . predniSONE (DELTASONE) 10 MG tablet Last dose 12/24/10      . simvastatin (ZOCOR) 40 MG tablet Take 40 mg by mouth at bedtime.        Marland Kitchen tiotropium (SPIRIVA) 18 MCG inhalation capsule Place 18 mcg into inhaler and inhale daily.        Marland Kitchen DISCONTD: conjugated estrogens (PREMARIN) vaginal cream Place 1 g vaginally 3 (three) times a week. Use on Monday, Wednesday and Friday       . DISCONTD: estrogens, conjugated, (PREMARIN) 0.625 MG tablet Take 0.625 mg by mouth daily. As directed       . DISCONTD: Fluticasone-Salmeterol (ADVAIR) 250-50 MCG/DOSE AEPB Inhale 1 puff into the lungs 2 (two) times daily.  14 each  0    Fatime Biswell Pager:  [phone] 12/24/2010, 3:44 PM

## 2010-12-24 NOTE — Assessment & Plan Note (Signed)
She has history of smoking, and pulmonary nodules as detailed above.  She has been scheduled for follow up CT chest with her primary care physician in 6 months.

## 2010-12-24 NOTE — Assessment & Plan Note (Signed)
She has extensive prior history of smoking.  I have encouraged her to continue with her smoking abstinence.  She has severe obstruction on spirometry today.  She has good response to inhaler therapy, and will continue her current regimen.  She is slowly recovering from her recent hospitalization with COPD exacerbation.  I don't think she needs additional antibiotics or prednisone at this time.  She did get her flu shot this season, and will get her booster pneumonia shot through her PCP.

## 2011-05-11 ENCOUNTER — Other Ambulatory Visit: Payer: Self-pay | Admitting: Family Medicine

## 2011-05-11 DIAGNOSIS — R911 Solitary pulmonary nodule: Secondary | ICD-10-CM

## 2011-05-13 ENCOUNTER — Ambulatory Visit
Admission: RE | Admit: 2011-05-13 | Discharge: 2011-05-13 | Disposition: A | Discharge status: Home / Self Care | Payer: Medicare Other | Source: Ambulatory Visit | Attending: Family Medicine | Admitting: Family Medicine

## 2011-05-13 DIAGNOSIS — R911 Solitary pulmonary nodule: Secondary | ICD-10-CM

## 2011-06-22 ENCOUNTER — Ambulatory Visit (INDEPENDENT_AMBULATORY_CARE_PROVIDER_SITE_OTHER): Payer: Medicare Other | Admitting: Pulmonary Disease

## 2011-06-22 ENCOUNTER — Encounter: Payer: Self-pay | Admitting: Pulmonary Disease

## 2011-06-22 VITALS — BP 122/76 | HR 87 | Temp 98.4°F | Ht 62.0 in | Wt 137.2 lb

## 2011-06-22 DIAGNOSIS — R0902 Hypoxemia: Secondary | ICD-10-CM

## 2011-06-22 DIAGNOSIS — R918 Other nonspecific abnormal finding of lung field: Secondary | ICD-10-CM

## 2011-06-22 DIAGNOSIS — J4489 Other specified chronic obstructive pulmonary disease: Secondary | ICD-10-CM

## 2011-06-22 DIAGNOSIS — J449 Chronic obstructive pulmonary disease, unspecified: Secondary | ICD-10-CM

## 2011-06-22 NOTE — Patient Instructions (Signed)
Will get copy of CT chest report  Follow up in one year >> call if help needed sooner

## 2011-06-22 NOTE — Assessment & Plan Note (Signed)
She is stable on current inhaler regimen of spiriva, and advair.  Advised for her to follow up in one year, but call if she has trouble with her breathing before this.

## 2011-06-22 NOTE — Assessment & Plan Note (Signed)
This is monitored by her PCP.  She is using 1.5 liters at night.

## 2011-06-22 NOTE — Assessment & Plan Note (Addendum)
Stable on CT chest from May 2013.  Needs follow up in November 2013.

## 2011-06-22 NOTE — Progress Notes (Signed)
Chief Complaint  Patient presents with  . 6 month follow-up    Patient states about the same as last visit. c/osob with exertion, wheezing, and cough with clear/green mucus.  Denies chest pain and chest tightness.     History of Present Illness: Kaitlyn Snyder is a 76 y.o. female former with COPD, nocturnal hypoxia, and pulmonary nodules.  She was given antibiotics and prednisone earlier this month for a sinus infection by her PCP.  She has improved.  Her breathing is doing okay.  She gets occasional cough and sputum.  She feels her inhalers help.  She is not using xopenex much.  She had her oxygen level dropped to 1.5 liters at night after ONO by PCP.  She had CT chest through PCP and told nodules were stable.   Past Medical History  Diagnosis Date  . COPD (chronic obstructive pulmonary disease)   . Hyperlipidemia   . Depression   . Squamous carcinoma   . Anxiety   . Anemia, B12 deficiency     Past Surgical History  Procedure Date  . Abdominal hysterectomy   . Cholecystectomy   . Gallbladder surgery     No Known Allergies  . Physical Exam: BP 122/76  Pulse 87  Temp 98.4 F (36.9 C) (Oral)  Ht 5\' 2"  (1.575 m)  Wt 137 lb 3.2 oz (62.234 kg)  BMI 25.09 kg/m2  SpO2 97% Body mass index is 25.09 kg/(m^2).  General - Thin HEENT - PERRLA, EOMI, no sinus tenderness, no oral exudate, no LAN, no thyromegaly Cardiac - s1s2 regular, no murmur Chest - prolonged exhalation, no wheeze/rales/dullness Abdomen - soft, nontender, no organomegaly Extremities - no e/c/c Neurologic - normal strength, CN intact Skin - no rashes Psychiatric - normal mood, behavior  Ct Chest Wo Contrast  05/13/2011  *RADIOLOGY REPORT*  Clinical Data: Follow-up pulmonary nodule.  CT CHEST WITHOUT CONTRAST  Technique:  Multidetector CT imaging of the chest was performed following the standard protocol without IV contrast.  Comparison: Chest CT 12/03/2010.  Findings: The chest wall is unremarkable  and stable.  No breast masses, supraclavicular or axillary mass or adenopathy.  The bony thorax is intact and appears stable.  No destructive bone lesions or spinal canal compromise.  Stable degenerative changes involving the spine.  The heart is normal in size.  Small stable pericardial effusion. No mediastinal or hilar lymphadenopathy.  Stable atherosclerotic calcifications involving the aorta and branch vessels including the coronary arteries.  The esophagus is unremarkable.  A small hiatal hernia is noted.  Examination of the lung parenchyma demonstrates stable emphysematous changes.  No acute pulmonary findings.  No interval change in the 6 mm nodule in the left lower lobe on image number 42.  No new lesions.  No pleural effusion.  The upper abdomen is unremarkable and stable.  Common bile duct dilatation likely due to prior cholecystectomy.   IMPRESSION:  1.  Stable emphysematous changes. 2.  Stable 6 mm left lower lobe pulmonary nodule.  Recommend follow- up noncontrast chest CT in 6 months which would be a 1-year follow- up from the initial chest CT. 3.  No acute pulmonary findings. 4.  Stable small pericardial effusion.  Original Report Authenticated By: P. Loralie Champagne, M.D.    Assessment/Plan:  Outpatient Encounter Prescriptions as of 06/22/2011  Medication Sig Dispense Refill  . albuterol (PROVENTIL) (2.5 MG/3ML) 0.083% nebulizer solution Take 3 mLs (2.5 mg total) by nebulization every 6 (six) hours as needed for wheezing or shortness of  breath.  75 mL  3  . aspirin 81 MG tablet Take 81 mg by mouth daily.        . Calcium Carbonate-Vitamin D (CALTRATE 600+D) 600-400 MG-UNIT per tablet Take 1 tablet by mouth 2 (two) times daily.        . cholecalciferol (VITAMIN D) 1000 UNITS tablet Take 4,000 Units by mouth daily.        Marland Kitchen FLUoxetine (PROZAC) 20 MG tablet Take 20 mg by mouth daily.        . fluticasone-salmeterol (ADVAIR HFA) 115-21 MCG/ACT inhaler Inhale 2 puffs into the lungs 2 (two)  times daily.        Marland Kitchen levalbuterol (XOPENEX HFA) 45 MCG/ACT inhaler Inhale 1-2 puffs into the lungs as needed.      . nitroGLYCERIN (NITROSTAT) 0.4 MG SL tablet Place 0.4 mg under the tongue every 5 (five) minutes as needed.        Marland Kitchen omeprazole (PRILOSEC) 20 MG capsule Take 20 mg by mouth 2 (two) times daily.        . predniSONE (DELTASONE) 10 MG tablet Last dose 12/24/10      . simvastatin (ZOCOR) 40 MG tablet Take 40 mg by mouth at bedtime.        Marland Kitchen tiotropium (SPIRIVA) 18 MCG inhalation capsule Place 18 mcg into inhaler and inhale daily.        Marland Kitchen albuterol (PROVENTIL HFA;VENTOLIN HFA) 108 (90 BASE) MCG/ACT inhaler Inhale 2 puffs into the lungs every 4 (four) hours as needed.        . Cyanocobalamin (VITAMIN B 12 PO) Take 2,000 mg by mouth.          Chandy Tarman Pager:  [phone] 06/22/2011, 10:55 AM

## 2011-11-04 ENCOUNTER — Other Ambulatory Visit: Payer: Self-pay | Admitting: Family Medicine

## 2011-11-04 DIAGNOSIS — R911 Solitary pulmonary nodule: Secondary | ICD-10-CM

## 2011-11-09 ENCOUNTER — Other Ambulatory Visit: Payer: Medicare Other

## 2011-11-11 ENCOUNTER — Ambulatory Visit
Admission: RE | Admit: 2011-11-11 | Discharge: 2011-11-11 | Disposition: A | Discharge status: Home / Self Care | Payer: Medicare Other | Source: Ambulatory Visit | Attending: Family Medicine | Admitting: Family Medicine

## 2011-11-11 DIAGNOSIS — R911 Solitary pulmonary nodule: Secondary | ICD-10-CM

## 2012-01-09 IMAGING — CR DG CHEST 1V PORT
1 series · 1 of 1 positions shown · non-contrast
Comparison: None.

CLINICAL DATA: Left-sided chest pain.  History of emphysema.

PORTABLE CHEST - 1 VIEW

[AP]
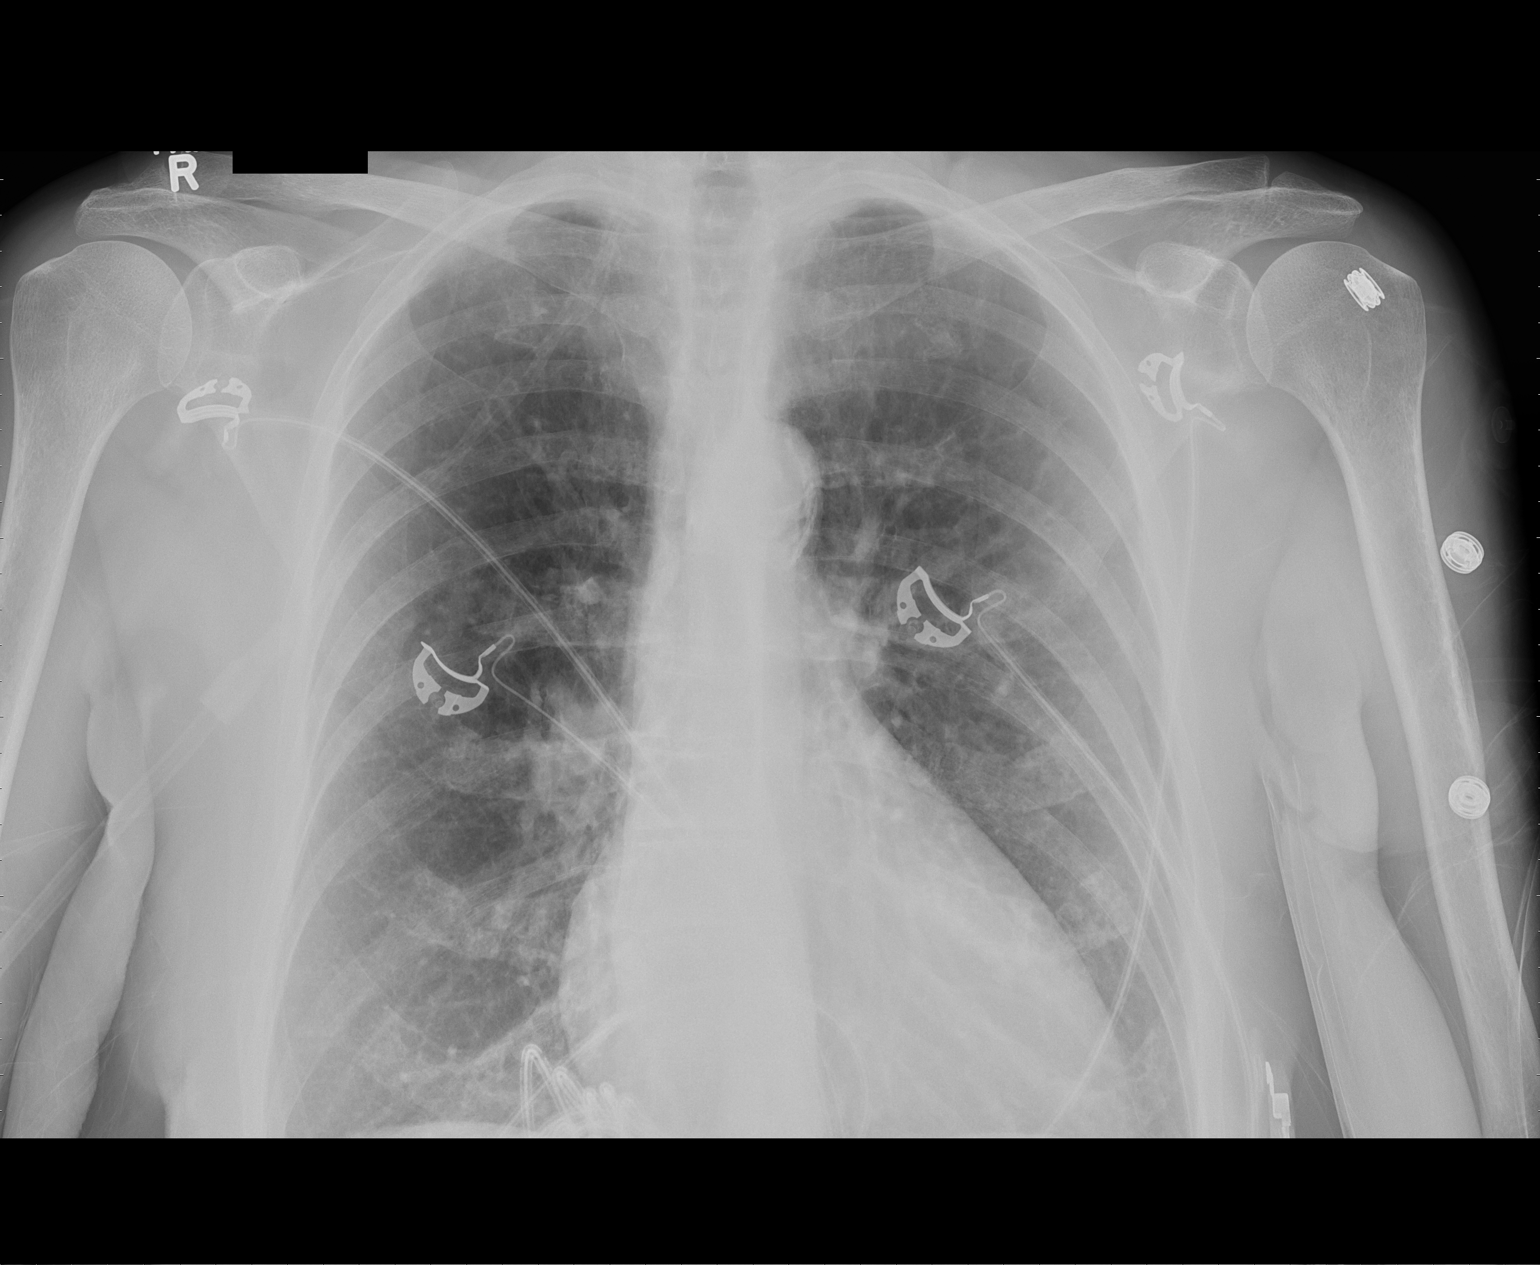

[1 of 1 positions shown; findings below may reference images not displayed]

FINDINGS: The lungs are hyperexpanded but clear.  The heart is at
the upper limits of normal in size.  Atherosclerotic calcifications
are seen in the aortic arch.  The bones and upper abdomen are
unremarkable.
IMPRESSION: Hyperexpansion without acute findings.

## 2012-06-29 ENCOUNTER — Encounter: Payer: Self-pay | Admitting: Pulmonary Disease

## 2012-06-29 ENCOUNTER — Ambulatory Visit (INDEPENDENT_AMBULATORY_CARE_PROVIDER_SITE_OTHER): Payer: Medicare Other | Admitting: Pulmonary Disease

## 2012-06-29 VITALS — BP 122/84 | HR 93 | Temp 98.0°F | Ht 60.0 in | Wt 124.0 lb

## 2012-06-29 DIAGNOSIS — R918 Other nonspecific abnormal finding of lung field: Secondary | ICD-10-CM

## 2012-06-29 DIAGNOSIS — J439 Emphysema, unspecified: Secondary | ICD-10-CM

## 2012-06-29 DIAGNOSIS — R0902 Hypoxemia: Secondary | ICD-10-CM

## 2012-06-29 DIAGNOSIS — J438 Other emphysema: Secondary | ICD-10-CM

## 2012-06-29 NOTE — Progress Notes (Signed)
Chief Complaint  Patient presents with  . COPD    Breathing is unchanged. Reports DOE, coughing and wheezing. Denies chest tightness.    History of Present Illness: Kaitlyn Snyder is a 77 y.o. female former with COPD, chronic hypoxic respiratory failure, and pulmonary nodules.  Cough more for few weeks, occsputum, clear to green, no blood, no fever, sinus throat okay, no cp Wheeze with exertion 1.5 liters at night Albuterol one or two per day  She gets occasional cough with clear to green sputum.  She denies hemoptysis or fever.  TESTS: CT chest 12/03/10 >> distal esophageal wall thickening.  5 mm nodule LLL, 6 mm nodule LLL, 6 mm nodule LLL, few small nodules RML.  Changes of emphysema in upper lobes. Spirometry 12/24/10 >> FEV1 0.89, FEV1% 45 CT chest 11/11/11 >> stable/decreased size of nodules, moderate/severe centrilobular emphysema 06/29/13 >> SpO2 on room air with exertion 87%; change to 2 liters oxygen with exertion and sleep  Kaitlyn Snyder  has a past medical history of COPD (chronic obstructive pulmonary disease); Hyperlipidemia; Depression; Squamous carcinoma; Anxiety; and Anemia, B12 deficiency.  Kaitlyn Snyder  has past surgical history that includes Abdominal hysterectomy; Cholecystectomy; and Gallbladder surgery.  Prior to Admission medications   Medication Sig Start Date End Date Taking? Authorizing Provider  albuterol (PROVENTIL HFA;VENTOLIN HFA) 108 (90 BASE) MCG/ACT inhaler Inhale 2 puffs into the lungs every 4 (four) hours as needed.     Yes Historical Provider, MD  albuterol (PROVENTIL) (2.5 MG/3ML) 0.083% nebulizer solution Take 3 mLs (2.5 mg total) by nebulization every 6 (six) hours as needed for wheezing or shortness of breath. 12/12/10 06/29/13 Yes Nishant Dhungel, MD  aspirin 81 MG tablet Take 81 mg by mouth daily.     Yes Historical Provider, MD  buPROPion (WELLBUTRIN SR) 100 MG 12 hr tablet Take 1 tablet by mouth daily. 06/03/12  Yes Historical  Provider, MD  Calcium Carbonate-Vitamin D (CALTRATE 600+D) 600-400 MG-UNIT per tablet Take 1 tablet by mouth 2 (two) times daily.     Yes Historical Provider, MD  cholecalciferol (VITAMIN D) 1000 UNITS tablet Take 4,000 Units by mouth daily.     Yes Historical Provider, MD  Cyanocobalamin (VITAMIN B 12 PO) Take 2,000 mg by mouth.     Yes Historical Provider, MD  FLUoxetine (PROZAC) 20 MG tablet Take 20 mg by mouth daily.     Yes Historical Provider, MD  Fluticasone-Salmeterol (ADVAIR) 250-50 MCG/DOSE AEPB Inhale 1-2 puffs into the lungs every 12 (twelve) hours.   Yes Historical Provider, MD  isosorbide mononitrate (IMDUR) 30 MG 24 hr tablet Take 1 tablet by mouth daily. 06/03/12  Yes Historical Provider, MD  levalbuterol (XOPENEX HFA) 45 MCG/ACT inhaler Inhale 1-2 puffs into the lungs as needed.   Yes Historical Provider, MD  nitroGLYCERIN (NITROSTAT) 0.4 MG SL tablet Place 0.4 mg under the tongue every 5 (five) minutes as needed.     Yes Historical Provider, MD  omeprazole (PRILOSEC) 20 MG capsule Take 20 mg by mouth 2 (two) times daily.     Yes Historical Provider, MD  simvastatin (ZOCOR) 40 MG tablet Take 40 mg by mouth at bedtime.     Yes Historical Provider, MD  tiotropium (SPIRIVA) 18 MCG inhalation capsule Place 18 mcg into inhaler and inhale daily.     Yes Historical Provider, MD    No Known Allergies   Physical Exam:  General - No distress ENT - No sinus tenderness, no oral exudate, no LAN Cardiac -  s1s2 regular, no murmur Chest - No wheeze/rales/dullness Back - No focal tenderness Abd - Soft, non-tender Ext - No edema Neuro - Normal strength Skin - No rashes Psych - normal mood, and behavior   Assessment/Plan:  Coralyn HellingVineet Mitsugi Schrader, MD [facility] Pager:  [phone]

## 2012-06-29 NOTE — Assessment & Plan Note (Signed)
She has intermittent symptoms.  I don't think she needs prednisone or antibiotics at present.  She is to continue spiriva, advair, and prn albuterol.    Discussed option of pulmonary rehab >> she would like to think about this.

## 2012-06-29 NOTE — Patient Instructions (Addendum)
Call if you are interested in enrolling in pulmonary rehab class Will change oxygen set up to 2 liters with exertion and sleep Will get overnight oxygen test  Follow up in 1 year

## 2012-06-29 NOTE — Assessment & Plan Note (Signed)
Stable to improved on CT chest from November 2013.  No additional follow up needed.

## 2012-06-29 NOTE — Assessment & Plan Note (Addendum)
She has oxygen desaturation with exertion.  She will need to use 2 liters oxygen with exertion and sleep.  Will arrange for ONO on 2 liters.

## 2012-07-26 ENCOUNTER — Telehealth: Payer: Self-pay | Admitting: Pulmonary Disease

## 2012-07-26 NOTE — Telephone Encounter (Signed)
ONO with 2 liters 07/19/12 >> Test time 9 hrs 58 min.  Basal SpO2 95.7%, low SpO2 89%.  Will have my nurse inform pt that ONO looks good.  She is to continue 2 liters oxygen.

## 2012-07-27 NOTE — Telephone Encounter (Signed)
Pt is aware of ONO results. 

## 2012-08-16 ENCOUNTER — Encounter: Payer: Self-pay | Admitting: Pulmonary Disease

## 2012-10-06 ENCOUNTER — Telehealth: Payer: Self-pay | Admitting: Pulmonary Disease

## 2012-10-06 MED ORDER — AZITHROMYCIN 250 MG PO TABS: ORAL_TABLET | ORAL | Status: DC

## 2012-10-06 NOTE — Telephone Encounter (Signed)
Not sure what we are treating. Suggest we send Zpak now and encourage fluids.

## 2012-10-06 NOTE — Telephone Encounter (Signed)
Called and spoke with pt and she stated that today at 1 pm she started with a fever of 100.8, she felt dizzy everytime she stands, she felt cold, stomach was upset and some chest discomfort/tightness.  Pt stated that she always has a cough and this has not increased.  Pt stated that her fever now is 101.2.  She stated that she needs something called to her pharmacy or she feels that she will get worse.  VS is not in the office today.  Will send to [facility] for recs.  Please advise. Thanks  No Known Allergies   Last ov--06/29/2012 No pending appts.

## 2012-10-06 NOTE — Telephone Encounter (Signed)
Called, spoke with pt.  Informed her of below recs per Dr. Maple Hudson.  She verbalized understanding of this and is aware rx sent to [facility].  Pt is to call back if symptoms do not improve or worsen and is to seek emergency care if needed.

## 2012-10-07 NOTE — Telephone Encounter (Signed)
Noted  

## 2012-10-11 ENCOUNTER — Telehealth: Payer: Self-pay | Admitting: Internal Medicine

## 2012-10-11 NOTE — Telephone Encounter (Signed)
I spoke with pt and she stated she is 100% better and is so thankful something was called in for her. This is FYI for [place] and needed nothing further

## 2013-01-15 ENCOUNTER — Inpatient Hospital Stay (HOSPITAL_COMMUNITY)
Admission: EM | Admit: 2013-01-15 | Discharge: 2013-01-24 | DRG: 208 | Disposition: A | Discharge status: Hospice | Payer: Medicare Other | Attending: Pulmonary Disease | Admitting: Pulmonary Disease

## 2013-01-15 ENCOUNTER — Emergency Department (HOSPITAL_COMMUNITY): Payer: Medicare Other

## 2013-01-15 ENCOUNTER — Encounter (HOSPITAL_COMMUNITY): Payer: Self-pay | Admitting: Emergency Medicine

## 2013-01-15 DIAGNOSIS — J9611 Chronic respiratory failure with hypoxia: Secondary | ICD-10-CM

## 2013-01-15 DIAGNOSIS — F411 Generalized anxiety disorder: Secondary | ICD-10-CM | POA: Diagnosis present

## 2013-01-15 DIAGNOSIS — F3289 Other specified depressive episodes: Secondary | ICD-10-CM | POA: Diagnosis present

## 2013-01-15 DIAGNOSIS — I10 Essential (primary) hypertension: Secondary | ICD-10-CM | POA: Diagnosis present

## 2013-01-15 DIAGNOSIS — G9340 Encephalopathy, unspecified: Secondary | ICD-10-CM | POA: Diagnosis present

## 2013-01-15 DIAGNOSIS — Z79899 Other long term (current) drug therapy: Secondary | ICD-10-CM

## 2013-01-15 DIAGNOSIS — E860 Dehydration: Secondary | ICD-10-CM

## 2013-01-15 DIAGNOSIS — F17200 Nicotine dependence, unspecified, uncomplicated: Secondary | ICD-10-CM | POA: Diagnosis present

## 2013-01-15 DIAGNOSIS — R34 Anuria and oliguria: Secondary | ICD-10-CM

## 2013-01-15 DIAGNOSIS — B370 Candidal stomatitis: Secondary | ICD-10-CM | POA: Diagnosis not present

## 2013-01-15 DIAGNOSIS — J9620 Acute and chronic respiratory failure, unspecified whether with hypoxia or hypercapnia: Secondary | ICD-10-CM

## 2013-01-15 DIAGNOSIS — Z515 Encounter for palliative care: Secondary | ICD-10-CM

## 2013-01-15 DIAGNOSIS — K219 Gastro-esophageal reflux disease without esophagitis: Secondary | ICD-10-CM | POA: Diagnosis present

## 2013-01-15 DIAGNOSIS — J441 Chronic obstructive pulmonary disease with (acute) exacerbation: Principal | ICD-10-CM

## 2013-01-15 DIAGNOSIS — IMO0002 Reserved for concepts with insufficient information to code with codable children: Secondary | ICD-10-CM

## 2013-01-15 DIAGNOSIS — E538 Deficiency of other specified B group vitamins: Secondary | ICD-10-CM | POA: Diagnosis present

## 2013-01-15 DIAGNOSIS — E785 Hyperlipidemia, unspecified: Secondary | ICD-10-CM | POA: Diagnosis present

## 2013-01-15 DIAGNOSIS — R918 Other nonspecific abnormal finding of lung field: Secondary | ICD-10-CM | POA: Diagnosis present

## 2013-01-15 DIAGNOSIS — F329 Major depressive disorder, single episode, unspecified: Secondary | ICD-10-CM | POA: Diagnosis present

## 2013-01-15 DIAGNOSIS — Z7982 Long term (current) use of aspirin: Secondary | ICD-10-CM

## 2013-01-15 DIAGNOSIS — Z66 Do not resuscitate: Secondary | ICD-10-CM | POA: Diagnosis not present

## 2013-01-15 DIAGNOSIS — J189 Pneumonia, unspecified organism: Secondary | ICD-10-CM

## 2013-01-15 DIAGNOSIS — Z720 Tobacco use: Secondary | ICD-10-CM

## 2013-01-15 DIAGNOSIS — E440 Moderate protein-calorie malnutrition: Secondary | ICD-10-CM | POA: Diagnosis present

## 2013-01-15 LAB — BLOOD GAS, ARTERIAL
Acid-Base Excess: 2.9 mmol/L — ABNORMAL HIGH (ref 0.0–2.0)
Bicarbonate: 28.5 mEq/L — ABNORMAL HIGH (ref 20.0–24.0)
Drawn by: 23404
O2 Content: 3.5 L/min
O2 SAT: 96.1 %
PO2 ART: 88.8 mmHg (ref 80.0–100.0)
Patient temperature: 98.6
TCO2: 30.3 mmol/L (ref 0–100)
pCO2 arterial: 57.3 mmHg (ref 35.0–45.0)
pH, Arterial: 7.317 — ABNORMAL LOW (ref 7.350–7.450)

## 2013-01-15 LAB — CBC
HCT: 40.4 % (ref 36.0–46.0)
HCT: 37.3 % (ref 36.0–46.0)
HEMOGLOBIN: 13.8 g/dL (ref 12.0–15.0)
HEMOGLOBIN: 12.8 g/dL (ref 12.0–15.0)
MCH: 33.5 pg (ref 26.0–34.0)
MCH: 33.8 pg (ref 26.0–34.0)
MCHC: 34.2 g/dL (ref 30.0–36.0)
MCHC: 34.3 g/dL (ref 30.0–36.0)
MCV: 98.1 fL (ref 78.0–100.0)
MCV: 98.4 fL (ref 78.0–100.0)
Platelets: 262 10*3/uL (ref 150–400)
Platelets: 243 10*3/uL (ref 150–400)
RBC: 4.12 MIL/uL (ref 3.87–5.11)
RBC: 3.79 MIL/uL — AB (ref 3.87–5.11)
RDW: 13.5 % (ref 11.5–15.5)
RDW: 13.4 % (ref 11.5–15.5)
WBC: 13.7 10*3/uL — ABNORMAL HIGH (ref 4.0–10.5)
WBC: 9.9 10*3/uL (ref 4.0–10.5)

## 2013-01-15 LAB — POCT I-STAT 3, ART BLOOD GAS (G3+)
Acid-Base Excess: 2 mmol/L (ref 0.0–2.0)
BICARBONATE: 28.7 meq/L — AB (ref 20.0–24.0)
O2 Saturation: 99 %
PCO2 ART: 50.9 mmHg — AB (ref 35.0–45.0)
PH ART: 7.359 (ref 7.350–7.450)
TCO2: 30 mmol/L (ref 0–100)
pO2, Arterial: 125 mmHg — ABNORMAL HIGH (ref 80.0–100.0)

## 2013-01-15 LAB — POCT I-STAT TROPONIN I: TROPONIN I, POC: 0 ng/mL (ref 0.00–0.08)

## 2013-01-15 LAB — EXPECTORATED SPUTUM ASSESSMENT W REFEX TO RESP CULTURE

## 2013-01-15 LAB — POCT I-STAT, CHEM 8
BUN: 16 mg/dL (ref 6–23)
CALCIUM ION: 1.2 mmol/L (ref 1.13–1.30)
Chloride: 97 mEq/L (ref 96–112)
Creatinine, Ser: 0.6 mg/dL (ref 0.50–1.10)
Glucose, Bld: 136 mg/dL — ABNORMAL HIGH (ref 70–99)
HCT: 44 % (ref 36.0–46.0)
Hemoglobin: 15 g/dL (ref 12.0–15.0)
Potassium: 4.4 mEq/L (ref 3.7–5.3)
Sodium: 136 mEq/L — ABNORMAL LOW (ref 137–147)
TCO2: 29 mmol/L (ref 0–100)

## 2013-01-15 LAB — CREATININE, SERUM
Creatinine, Ser: 0.5 mg/dL (ref 0.50–1.10)
GFR calc Af Amer: >90 mL/min (ref >90)
GFR calc non Af Amer: >90 mL/min (ref >90)

## 2013-01-15 LAB — PRO B NATRIURETIC PEPTIDE: PRO B NATRI PEPTIDE: 1564 pg/mL — AB (ref 0–450)

## 2013-01-15 LAB — MRSA PCR SCREENING: MRSA BY PCR: NEGATIVE

## 2013-01-15 MED ORDER — ONDANSETRON HCL 4 MG/2ML IJ SOLN
4.0000 mg | Freq: Four times a day (QID) | INTRAMUSCULAR | Status: DC | PRN
  Administered 2013-01-21: 4 mg via INTRAVENOUS
  Filled 2013-01-15: qty 2

## 2013-01-15 MED ORDER — ALBUTEROL SULFATE (2.5 MG/3ML) 0.083% IN NEBU
5.0000 mg | INHALATION_SOLUTION | Freq: Once | RESPIRATORY_TRACT | Status: AC
  Administered 2013-01-15: 5 mg via RESPIRATORY_TRACT
  Filled 2013-01-15: qty 6

## 2013-01-15 MED ORDER — LORAZEPAM 2 MG/ML IJ SOLN
INTRAMUSCULAR | Status: AC
  Filled 2013-01-15: qty 1

## 2013-01-15 MED ORDER — LORAZEPAM 2 MG/ML IJ SOLN
0.5000 mg | Freq: Once | INTRAMUSCULAR | Status: AC
  Administered 2013-01-15: 0.5 mg via INTRAVENOUS

## 2013-01-15 MED ORDER — ENOXAPARIN SODIUM 40 MG/0.4ML ~~LOC~~ SOLN
40.0000 mg | SUBCUTANEOUS | Status: DC
Start: 2013-01-15 — End: 2013-01-24
  Administered 2013-01-15 – 2013-01-23 (×8): 40 mg via SUBCUTANEOUS
  Filled 2013-01-15 (×11): qty 0.4

## 2013-01-15 MED ORDER — ALBUTEROL SULFATE (2.5 MG/3ML) 0.083% IN NEBU
2.5000 mg | INHALATION_SOLUTION | Freq: Four times a day (QID) | RESPIRATORY_TRACT | Status: DC
  Administered 2013-01-15 – 2013-01-16 (×5): 2.5 mg via RESPIRATORY_TRACT
  Filled 2013-01-15 (×5): qty 3

## 2013-01-15 MED ORDER — LORAZEPAM 2 MG/ML IJ SOLN
0.5000 mg | Freq: Once | INTRAMUSCULAR | Status: AC
  Administered 2013-01-15: 0.5 mg via INTRAVENOUS
  Filled 2013-01-15: qty 1

## 2013-01-15 MED ORDER — LORAZEPAM 0.5 MG PO TABS
0.2500 mg | ORAL_TABLET | Freq: Two times a day (BID) | ORAL | Status: DC | PRN
  Administered 2013-01-15: 0.25 mg via ORAL
  Filled 2013-01-15: qty 1

## 2013-01-15 MED ORDER — ALBUTEROL SULFATE (2.5 MG/3ML) 0.083% IN NEBU: 2.5000 mg | INHALATION_SOLUTION | RESPIRATORY_TRACT | Status: DC | PRN

## 2013-01-15 MED ORDER — ATORVASTATIN CALCIUM 20 MG PO TABS
20.0000 mg | ORAL_TABLET | Freq: Every day | ORAL | Status: DC
  Administered 2013-01-15 – 2013-01-17 (×2): 20 mg via ORAL
  Filled 2013-01-15 (×4): qty 1

## 2013-01-15 MED ORDER — ACETAMINOPHEN 325 MG PO TABS
650.0000 mg | ORAL_TABLET | Freq: Four times a day (QID) | ORAL | Status: DC | PRN
  Administered 2013-01-20: 650 mg via ORAL
  Filled 2013-01-15 (×2): qty 2

## 2013-01-15 MED ORDER — DILTIAZEM HCL ER 120 MG PO CP24
120.0000 mg | ORAL_CAPSULE | Freq: Every day | ORAL | Status: DC
  Administered 2013-01-15 – 2013-01-17 (×3): 120 mg via ORAL
  Filled 2013-01-15 (×3): qty 1

## 2013-01-15 MED ORDER — IPRATROPIUM BROMIDE 0.02 % IN SOLN
0.5000 mg | Freq: Four times a day (QID) | RESPIRATORY_TRACT | Status: DC
  Administered 2013-01-15 – 2013-01-16 (×5): 0.5 mg via RESPIRATORY_TRACT
  Filled 2013-01-15 (×5): qty 2.5

## 2013-01-15 MED ORDER — FLUOXETINE HCL 20 MG PO TABS
20.0000 mg | ORAL_TABLET | Freq: Every day | ORAL | Status: DC
  Administered 2013-01-15 – 2013-01-17 (×2): 20 mg via ORAL
  Filled 2013-01-15 (×4): qty 1

## 2013-01-15 MED ORDER — MAGNESIUM SULFATE 40 MG/ML IJ SOLN
2.0000 g | Freq: Once | INTRAMUSCULAR | Status: AC
  Administered 2013-01-15: 2 g via INTRAVENOUS
  Filled 2013-01-15: qty 50

## 2013-01-15 MED ORDER — ONDANSETRON HCL 4 MG PO TABS
4.0000 mg | ORAL_TABLET | Freq: Four times a day (QID) | ORAL | Status: DC | PRN
Start: 2013-01-15 — End: 2013-01-24

## 2013-01-15 MED ORDER — SENNOSIDES-DOCUSATE SODIUM 8.6-50 MG PO TABS
1.0000 | ORAL_TABLET | Freq: Every evening | ORAL | Status: DC | PRN
Start: 2013-01-15 — End: 2013-01-18
  Filled 2013-01-15: qty 1

## 2013-01-15 MED ORDER — SODIUM CHLORIDE 0.9 % IV SOLN
INTRAVENOUS | Status: DC
  Administered 2013-01-15 – 2013-01-16 (×2): via INTRAVENOUS

## 2013-01-15 MED ORDER — ISOSORBIDE MONONITRATE ER 30 MG PO TB24
30.0000 mg | ORAL_TABLET | Freq: Every day | ORAL | Status: DC
  Administered 2013-01-15 – 2013-01-17 (×3): 30 mg via ORAL
  Filled 2013-01-15 (×4): qty 1

## 2013-01-15 MED ORDER — METHYLPREDNISOLONE SODIUM SUCC 125 MG IJ SOLR
80.0000 mg | Freq: Four times a day (QID) | INTRAMUSCULAR | Status: DC
  Administered 2013-01-15 – 2013-01-16 (×6): 80 mg via INTRAVENOUS
  Filled 2013-01-15 (×9): qty 1.28

## 2013-01-15 MED ORDER — ACETAMINOPHEN 650 MG RE SUPP
650.0000 mg | Freq: Four times a day (QID) | RECTAL | Status: DC | PRN
  Administered 2013-01-20: 650 mg via RECTAL
  Filled 2013-01-15: qty 1

## 2013-01-15 MED ORDER — ASPIRIN 81 MG PO TABS: 81.0000 mg | ORAL_TABLET | Freq: Every day | ORAL | Status: DC

## 2013-01-15 MED ORDER — BUPROPION HCL ER (SR) 100 MG PO TB12
100.0000 mg | ORAL_TABLET | Freq: Every day | ORAL | Status: DC
  Administered 2013-01-15 – 2013-01-17 (×3): 100 mg via ORAL
  Filled 2013-01-15 (×4): qty 1

## 2013-01-15 MED ORDER — ASPIRIN EC 81 MG PO TBEC
81.0000 mg | DELAYED_RELEASE_TABLET | Freq: Every day | ORAL | Status: DC
  Administered 2013-01-15 – 2013-01-17 (×3): 81 mg via ORAL
  Filled 2013-01-15 (×4): qty 1

## 2013-01-15 MED ORDER — MOMETASONE FURO-FORMOTEROL FUM 100-5 MCG/ACT IN AERO
2.0000 | INHALATION_SPRAY | Freq: Two times a day (BID) | RESPIRATORY_TRACT | Status: DC
  Administered 2013-01-15: 2 via RESPIRATORY_TRACT
  Filled 2013-01-15: qty 8.8

## 2013-01-15 MED ORDER — OXYCODONE HCL 5 MG PO TABS: 5.0000 mg | ORAL_TABLET | ORAL | Status: DC | PRN

## 2013-01-15 MED ORDER — PANTOPRAZOLE SODIUM 40 MG PO TBEC
40.0000 mg | DELAYED_RELEASE_TABLET | Freq: Every day | ORAL | Status: DC
  Administered 2013-01-15 – 2013-01-17 (×3): 40 mg via ORAL
  Filled 2013-01-15 (×4): qty 1

## 2013-01-15 MED ORDER — LEVOFLOXACIN IN D5W 750 MG/150ML IV SOLN
750.0000 mg | Freq: Once | INTRAVENOUS | Status: AC
  Administered 2013-01-15: 750 mg via INTRAVENOUS
  Filled 2013-01-15: qty 150

## 2013-01-15 MED ORDER — LEVOFLOXACIN IN D5W 750 MG/150ML IV SOLN
750.0000 mg | INTRAVENOUS | Status: DC
  Administered 2013-01-15: 750 mg via INTRAVENOUS
  Filled 2013-01-15 (×2): qty 150

## 2013-01-15 NOTE — ED Notes (Signed)
Admitting at bedside 

## 2013-01-15 NOTE — ED Notes (Signed)
Pt placed back on BIPAP, pt's 02 sats reading 88% on 4L 02.

## 2013-01-15 NOTE — ED Notes (Signed)
Old and New EKG given to Dr Dierdre Highmanpitz

## 2013-01-15 NOTE — ED Provider Notes (Signed)
CSN: [id number]     Arrival date & time 01/15/13  0446 History   First MD Initiated Contact with Patient 01/15/13 0447     Chief Complaint  Patient presents with  . Shortness of Breath   (Consider location/radiation/quality/duration/timing/severity/associated sxs/prior Treatment) HPI History provided by patient and EMS. Sick over the last few days with cough, wheezing and difficulty breathing. Has history of COPD. Diagnosed with bronchitis by primary care physician. Symptoms worse tonight and EMS was called. Patient was given Solu-Medrol, albuterol and Atrovent and placed on CPAP. Some symptomatic relief in route. Patient denies any chest pain, or lower extremity swelling. She reports low-grade fevers at home over the last few days. Past Medical History  Diagnosis Date  . COPD (chronic obstructive pulmonary disease)   . Hyperlipidemia   . Depression   . Squamous carcinoma   . Anxiety   . Anemia, B12 deficiency    Past Surgical History  Procedure Laterality Date  . Abdominal hysterectomy    . Cholecystectomy    . Gallbladder surgery     Family History  Problem Relation Age of Onset  . Kidney failure Brother   . Emphysema Father    History  Substance Use Topics  . Smoking status: Current Some Day Smoker -- 0.50 packs/day for 55 years    Types: Cigarettes    Last Attempt to Quit: 12/03/2010  . Smokeless tobacco: Never Used  . Alcohol Use: 3.6 oz/week    6 Cans of beer per week     Comment: beer q week   OB History   Grav Para Term Preterm Abortions TAB SAB Ect Mult Living                 Review of Systems  Constitutional: Negative for chills and diaphoresis.  HENT: Negative for sore throat.   Respiratory: Positive for shortness of breath and wheezing.   Cardiovascular: Negative for chest pain.  Gastrointestinal: Negative for vomiting.  Musculoskeletal: Negative for back pain.  Skin: Negative for rash.  Neurological: Negative for headaches.  All other systems  reviewed and are negative.    Allergies  Review of patient's allergies indicates no known allergies.  Home Medications   Current Outpatient Rx  Name  Route  Sig  Dispense  Refill  . albuterol (PROVENTIL HFA;VENTOLIN HFA) 108 (90 BASE) MCG/ACT inhaler   Inhalation   Inhale 2 puffs into the lungs every 4 (four) hours as needed.           Marland Kitchen albuterol (PROVENTIL) (2.5 MG/3ML) 0.083% nebulizer solution   Nebulization   Take 3 mLs (2.5 mg total) by nebulization every 6 (six) hours as needed for wheezing or shortness of breath.   75 mL   3   . aspirin 81 MG tablet   Oral   Take 81 mg by mouth daily.           Marland Kitchen azithromycin (ZITHROMAX) 250 MG tablet      Take as directed   6 each   0   . buPROPion (WELLBUTRIN SR) 100 MG 12 hr tablet   Oral   Take 1 tablet by mouth daily.         . Calcium Carbonate-Vitamin D (CALTRATE 600+D) 600-400 MG-UNIT per tablet   Oral   Take 1 tablet by mouth 2 (two) times daily.           . cholecalciferol (VITAMIN D) 1000 UNITS tablet   Oral   Take 4,000 Units by mouth daily.           Marland Kitchen  Cyanocobalamin (VITAMIN B 12 PO)   Oral   Take 2,000 mg by mouth.           Marland Kitchen. FLUoxetine (PROZAC) 20 MG tablet   Oral   Take 20 mg by mouth daily.           . Fluticasone-Salmeterol (ADVAIR) 250-50 MCG/DOSE AEPB   Inhalation   Inhale 1-2 puffs into the lungs every 12 (twelve) hours.         . isosorbide mononitrate (IMDUR) 30 MG 24 hr tablet   Oral   Take 1 tablet by mouth daily.         Marland Kitchen. levalbuterol (XOPENEX HFA) 45 MCG/ACT inhaler   Inhalation   Inhale 1-2 puffs into the lungs as needed.         . nitroGLYCERIN (NITROSTAT) 0.4 MG SL tablet   Sublingual   Place 0.4 mg under the tongue every 5 (five) minutes as needed.           Marland Kitchen. omeprazole (PRILOSEC) 20 MG capsule   Oral   Take 20 mg by mouth 2 (two) times daily.           . simvastatin (ZOCOR) 40 MG tablet   Oral   Take 40 mg by mouth at bedtime.           Marland Kitchen.  tiotropium (SPIRIVA) 18 MCG inhalation capsule   Inhalation   Place 18 mcg into inhaler and inhale daily.            BP 144/69  Pulse 125  Resp 31  Wt 124 lb (56.246 kg)  SpO2 99% Physical Exam  Constitutional: She is oriented to person, place, and time. She appears well-developed and well-nourished.  HENT:  Head: Normocephalic and atraumatic.  Eyes: EOM are normal. Pupils are equal, round, and reactive to light.  Neck: Neck supple.  Cardiovascular: Regular rhythm and intact distal pulses.   Tachycardic  Pulmonary/Chest: She exhibits no tenderness.  Tachypnea with moderate respiratory distress. Decreased bilateral breath sounds  Abdominal: Soft. There is no tenderness.  Musculoskeletal: Normal range of motion. She exhibits no edema and no tenderness.  Neurological: She is alert and oriented to person, place, and time.  Speech in 1-2 word sentences  Skin: Skin is warm and dry.    ED Course  Procedures (including critical care time) Labs Review Labs Reviewed  CBC - Abnormal; Notable for the following:    WBC 13.7 (*)    All other components within normal limits  PRO B NATRIURETIC PEPTIDE - Abnormal; Notable for the following:    Pro B Natriuretic peptide (BNP) 1564.0 (*)    All other components within normal limits  POCT I-STAT, CHEM 8 - Abnormal; Notable for the following:    Sodium 136 (*)    Glucose, Bld 136 (*)    All other components within normal limits  POCT I-STAT TROPONIN I   Imaging Review Dg Chest Portable 1 View  01/15/2013   CLINICAL DATA:  Shortness of breath, COPD.  EXAM: PORTABLE CHEST - 1 VIEW  COMPARISON:  Chest radiograph January 12, 2013  FINDINGS: Cardiac silhouette is unremarkable, mildly calcified aortic knob. Similar increased lung volumes and chronic interstitial changes without superimposed pleural effusions or focal consolidations. No pneumothorax.  Multiple EKG lines overlie the patient and may obscure subtle underlying pathology. Patient is  osteopenic. Calcifications in the neck may be vascular.  IMPRESSION: Stable COPD without superimposed acute cardiopulmonary process.   Electronically Signed   By: Pernell Dupreourtnay  Bloomer   On: 01/15/2013 05:40    EKG Interpretation    Date/Time:  Sunday January 15 2013 04:59:33 EST Ventricular Rate:  123 PR Interval:  160 QRS Duration: 102 QT Interval:  325 QTC Calculation: 465 R Axis:   81 Text Interpretation:  Sinus tachycardia Confirmed by Jani Ploeger  MD, Diar Berkel [phone]) on 01/15/2013 5:10:07 AM           CRITICAL CARE Performed by: Sunnie Nielsen Total critical care time: 45 Critical care time was exclusive of separately billable procedures and treating other patients. Critical care was necessary to treat or prevent imminent or life-threatening deterioration. Critical care was time spent personally by me on the following activities: development of treatment plan with patient and/or surrogate as well as nursing, discussions with consultants, evaluation of patient's response to treatment, examination of patient, obtaining history from patient or surrogate, ordering and performing treatments and interventions, ordering and review of laboratory studies, ordering and review of radiographic studies, pulse oximetry and re-evaluation of patient's condition. BiPAP on arrival. EKG. Chest x-ray. Labs. IV antibiotics. Continued albuterol treatments. IV magnesium. 6:20 AM recheck after breathing treatment, now has audible expiratory wheezes with mild improvement in air movement. RT bedside. Patient not tolerating BiPAP, requesting something to help her calm down. 7:02 AM Dr. Julian Reil to admit step down unit, after ativan is back on BiPAP and much more comfortable, speech in 4-5 word sentences.  MDM  Diagnosis: Acute COPD exacerbation  Requiring BiPAP Evaluated with EKG, chest x-ray labs reviewed as above Steroids prior to arrival Albuterol treatments, serial evaluations, slowly improving condition Admit step  down unit    Sunnie Nielsen, MD 01/15/13 2251

## 2013-01-15 NOTE — ED Notes (Signed)
RT called and asked to wean pt off of BIPAP per MD request.

## 2013-01-15 NOTE — ED Notes (Signed)
This RN spoke with pt's daughter Elita Quickam per pt's approval. Pt's family says they will visit her early afternoon.

## 2013-01-15 NOTE — Progress Notes (Signed)
Entered room to give patient scheduled neb and noticed her WOB and inability to complete sentences, BBS exp wheezing throughout and breathing treatment did not change her condition. Patient behaving confused and is pulling at things still has severe increased WOB, ABG obtained  ABG    Component Value Date/Time   PHART 7.317* 01/15/2013 2140   PCO2ART 57.3* 01/15/2013 2140   PO2ART 88.8 01/15/2013 2140   HCO3 28.5* 01/15/2013 2140   TCO2 30.3 01/15/2013 2140   O2SAT 96.1 01/15/2013 2140    Awaiting MD orders.

## 2013-01-15 NOTE — ED Notes (Signed)
Pt here by [place] [facility] for sob and respiratory distress, pt with hx of copd and asthma, recently test for flu and was negative and diagnosed with bronchitis and started on prednisone, per ems, severe resp distress on arrival. Placed on bipap pta here, pt given neb of albuterol and atrovent and solumedrol also by ems, pt has 20 g iv lock to left ac. Voices relief on arrival here.

## 2013-01-15 NOTE — Progress Notes (Signed)
Placed PT on venturi mask at 50%  PT sats are 98%

## 2013-01-15 NOTE — H&P (Signed)
[facility]          History and Physical    PCP:   MCNEILL,WENDY, MD   Chief Complaint:  Severe shortness of breath  HPI: Patient is a pleasant 78 year old white woman who has a past medical history of COPD who is maintained on Advair, Spiriva and when necessary albuterol as well as exertional and nightly oxygen. Her pulmonologist is Dr. Craige Cotta. For about 5 days she has noticed increased shortness of breath and production of green sputum. On Wednesday, 4 days prior to admission, she visited her PCP who prescribed her a Z-Pak as well as a steroid taper. Because her symptoms failed to improve she presents to the emergency department today. Of note her PCP checked a flu swab that was negative. She denies fever, chills, sick contacts, recent travel. Chest x-ray does not show any acute pulmonary disease. She has been intermittently on BiPAP in the emergency department for hypoxemia. We have been asked to admit her for further evaluation and management. She continues to smoke.  Allergies:  No Known Allergies    Past Medical History  Diagnosis Date  . COPD (chronic obstructive pulmonary disease)   . Hyperlipidemia   . Depression   . Squamous carcinoma   . Anxiety   . Anemia, B12 deficiency     Past Surgical History  Procedure Laterality Date  . Abdominal hysterectomy    . Cholecystectomy    . Gallbladder surgery      Prior to Admission medications   Medication Sig Start Date End Date Taking? Authorizing Provider  albuterol (PROVENTIL HFA;VENTOLIN HFA) 108 (90 BASE) MCG/ACT inhaler Inhale 2 puffs into the lungs every 4 (four) hours as needed.     Yes Historical Provider, MD  albuterol (PROVENTIL) (2.5 MG/3ML) 0.083% nebulizer solution Take 3 mLs (2.5 mg total) by nebulization every 6 (six) hours as needed for wheezing or shortness of breath. 12/12/10 06/29/13 Yes Nishant Dhungel, MD  aspirin 81 MG tablet Take 81 mg by mouth daily.     Yes Historical Provider, MD   atorvastatin (LIPITOR) 20 MG tablet Take 20 mg by mouth daily.   Yes Historical Provider, MD  azithromycin (ZITHROMAX) 250 MG tablet Take as directed 10/06/12  Yes Waymon Budge, MD  buPROPion [facility] SR) 100 MG 12 hr tablet Take 1 tablet by mouth daily. 06/03/12  Yes Historical Provider, MD  Calcium Carbonate-Vitamin D (CALTRATE 600+D) 600-400 MG-UNIT per tablet Take 1 tablet by mouth 2 (two) times daily.     Yes Historical Provider, MD  cholecalciferol (VITAMIN D) 1000 UNITS tablet Take 4,000 Units by mouth daily.     Yes Historical Provider, MD  Cyanocobalamin (VITAMIN B 12 PO) Take 2,000 mg by mouth.     Yes Historical Provider, MD  diltiazem (DILACOR XR) 120 MG 24 hr capsule Take 120 mg by mouth daily.   Yes Historical Provider, MD  FLUoxetine (PROZAC) 20 MG tablet Take 20 mg by mouth daily.     Yes Historical Provider, MD  fluticasone-salmeterol (ADVAIR HFA) 115-21 MCG/ACT inhaler Inhale 2 puffs into the lungs 2 (two) times daily.   Yes Historical Provider, MD  ibandronate (BONIVA) 150 MG tablet Take 150 mg by mouth every 30 (thirty) days. Take in the morning with a full glass of water, on an empty stomach, and do not take anything else by mouth or lie down for the next 30 min.   Yes Historical Provider, MD  isosorbide mononitrate (IMDUR) 30 MG 24 hr tablet Take 1  tablet by mouth daily. 06/03/12  Yes Historical Provider, MD  omeprazole (PRILOSEC) 20 MG capsule Take 20 mg by mouth 2 (two) times daily.     Yes Historical Provider, MD  predniSONE (DELTASONE) 20 MG tablet Take 20-60 mg by mouth daily. Take 3 tablets(60mg ) daily for 2 days, then take 2 tablets(40mg ) daily for 2 days, then take 1 tablet(20mg ) daily for 2 days   Yes Historical Provider, MD  tiotropium (SPIRIVA) 18 MCG inhalation capsule Place 18 mcg into inhaler and inhale daily.     Yes Historical Provider, MD  nitroGLYCERIN (NITROSTAT) 0.4 MG SL tablet Place 0.4 mg under the tongue every 5 (five) minutes as needed.       Historical Provider, MD    Social History:  reports that she has been smoking Cigarettes.  She has a 27.5 pack-year smoking history. She has never used smokeless tobacco. She reports that she drinks about 3.6 ounces of alcohol per week. She reports that she does not use illicit drugs.  Family History  Problem Relation Age of Onset  . Kidney failure Brother   . Emphysema Father     Review of Systems:  Constitutional: Denies fever, chills, diaphoresis, appetite change and fatigue.  HEENT: Denies photophobia, eye pain, redness, hearing loss, ear pain, congestion, sore throat, rhinorrhea, sneezing, mouth sores, trouble swallowing, neck pain, neck stiffness and tinnitus.   Respiratory: Positive for SOB, DOE, cough, chest tightness,  and wheezing.   Cardiovascular: Denies chest pain, palpitations and leg swelling.  Gastrointestinal: Denies nausea, vomiting, abdominal pain, diarrhea, constipation, blood in stool and abdominal distention.  Genitourinary: Denies dysuria, urgency, frequency, hematuria, flank pain and difficulty urinating.  Endocrine: Denies: hot or cold intolerance, sweats, changes in hair or nails, polyuria, polydipsia. Musculoskeletal: Denies myalgias, back pain, joint swelling, arthralgias and gait problem.  Skin: Denies pallor, rash and wound.  Neurological: Denies dizziness, seizures, syncope, weakness, light-headedness, numbness and headaches.  Hematological: Denies adenopathy. Easy bruising, personal or family bleeding history  Psychiatric/Behavioral: Denies suicidal ideation, mood changes, confusion, nervousness, sleep disturbance and agitation   Physical Exam: Blood pressure 122/47, pulse 129, temperature 98.4 F (36.9 C), temperature source Rectal, resp. rate 25, weight 56.246 kg (124 lb), SpO2 97.00%. General: Alert, awake, oriented x3, significant increased work of breathing and inability to speak in full sentences given shortness of breath. HEENT: Normocephalic,  atraumatic, pupils equal and reactive to light, extraocular movements intact. Neck: Supple, no JVD, no lymphadenopathy, no bruits, no goiter. Cardiovascular: Tachycardic, regular, no murmurs, rubs or gallops auscultated. Lungs: Poor to fair air movement, bilateral expiratory wheezing. Abdomen: Soft, nontender, nondistended, positive bowel sounds, no masses or organomegaly noted. Extremities: No clubbing, cyanosis or edema, positive pedal pulses. Neurologic: Grossly intact and nonfocal.  Labs on Admission:  Results for orders placed during the hospital encounter of 01/15/13 (from the past 48 hour(s))  CBC     Status: Abnormal   Collection Time    01/15/13  5:05 AM      Result Value Range   WBC 13.7 (*) 4.0 - 10.5 K/uL   RBC 4.12  3.87 - 5.11 MIL/uL   Hemoglobin 13.8  12.0 - 15.0 g/dL   HCT 16.140.4  09.636.0 - 04.546.0 %   MCV 98.1  78.0 - 100.0 fL   MCH 33.5  26.0 - 34.0 pg   MCHC 34.2  30.0 - 36.0 g/dL   RDW 40.913.5  81.111.5 - 91.415.5 %   Platelets 262  150 - 400 K/uL  PRO B NATRIURETIC  PEPTIDE     Status: Abnormal   Collection Time    01/15/13  5:06 AM      Result Value Range   Pro B Natriuretic peptide (BNP) 1564.0 (*) 0 - 450 pg/mL  POCT I-STAT TROPONIN I     Status: None   Collection Time    01/15/13  5:12 AM      Result Value Range   Troponin i, poc 0.00  0.00 - 0.08 ng/mL   Comment 3            Comment: Due to the release kinetics of cTnI,     a negative result within the first hours     of the onset of symptoms does not rule out     myocardial infarction with certainty.     If myocardial infarction is still suspected,     repeat the test at appropriate intervals.  POCT I-STAT, CHEM 8     Status: Abnormal   Collection Time    01/15/13  5:13 AM      Result Value Range   Sodium 136 (*) 137 - 147 mEq/L   Potassium 4.4  3.7 - 5.3 mEq/L   Chloride 97  96 - 112 mEq/L   BUN 16  6 - 23 mg/dL   Creatinine, Ser 1.61  0.50 - 1.10 mg/dL   Glucose, Bld 096 (*) 70 - 99 mg/dL   Calcium, Ion 0.45   4.09 - 1.30 mmol/L   TCO2 29  0 - 100 mmol/L   Hemoglobin 15.0  12.0 - 15.0 g/dL   HCT 81.1  91.4 - 78.2 %    Radiological Exams on Admission: Dg Chest Portable 1 View  01/15/2013   CLINICAL DATA:  Shortness of breath, COPD.  EXAM: PORTABLE CHEST - 1 VIEW  COMPARISON:  Chest radiograph January 12, 2013  FINDINGS: Cardiac silhouette is unremarkable, mildly calcified aortic knob. Similar increased lung volumes and chronic interstitial changes without superimposed pleural effusions or focal consolidations. No pneumothorax.  Multiple EKG lines overlie the patient and may obscure subtle underlying pathology. Patient is osteopenic. Calcifications in the neck may be vascular.  IMPRESSION: Stable COPD without superimposed acute cardiopulmonary process.   Electronically Signed   By: Awilda Metro   On: 01/15/2013 05:40    Assessment/Plan Principal Problem:   Acute-on-chronic respiratory failure Active Problems:   COPD exacerbation   COPD with acute exacerbation   Acute on chronic hypoxemic respiratory failure -Secondary to COPD with acute exacerbation. -No clear source for infection.  COPD with acute exacerbation -Will admit to step down unit given her intermittent need for BiPAP and her still significantly increased work of breathing. -Will order ABG to make sure she is exchanging CO2 appropriately. -Solu-Medrol 80 mg IV every 6 hours has been ordered. -Albuterol/Atrovent nebs. -Will start on Levaquin. -Blood/sputum cultures have been ordered. -Flu swab done 3 days ago was negative, see no need to repeat this or treat empirically with Tamiflu.  DVT prophylaxis -Lovenox  CODE STATUS -Full code as discussed with patient at bedside.   Time Spent on Admission: 75 minutes  HERNANDEZ ACOSTA,ESTELA [facility] Pager: [phone] 01/15/2013, 9:56 AM

## 2013-01-15 NOTE — ED Notes (Signed)
MD at bedside. Dr Dierdre Highmanopitz

## 2013-01-16 LAB — BLOOD GAS, ARTERIAL
ACID-BASE EXCESS: 3.2 mmol/L — AB (ref 0.0–2.0)
Bicarbonate: 27.7 mEq/L — ABNORMAL HIGH (ref 20.0–24.0)
DRAWN BY: 40530
Delivery systems: POSITIVE
Expiratory PAP: 6
FIO2: 0.4 %
INSPIRATORY PAP: 12
O2 SAT: 97.7 %
PATIENT TEMPERATURE: 98.6
TCO2: 29.1 mmol/L (ref 0–100)
pCO2 arterial: 45.9 mmHg — ABNORMAL HIGH (ref 35.0–45.0)
pH, Arterial: 7.398 (ref 7.350–7.450)
pO2, Arterial: 115 mmHg — ABNORMAL HIGH (ref 80.0–100.0)

## 2013-01-16 LAB — BASIC METABOLIC PANEL
BUN: 20 mg/dL (ref 6–23)
CALCIUM: 8.3 mg/dL — AB (ref 8.4–10.5)
CO2: 28 meq/L (ref 19–32)
Chloride: 96 mEq/L (ref 96–112)
Creatinine, Ser: 0.55 mg/dL (ref 0.50–1.10)
GFR calc non Af Amer: 89 mL/min — ABNORMAL LOW (ref >90)
Glucose, Bld: 112 mg/dL — ABNORMAL HIGH (ref 70–99)
Potassium: 4.5 mEq/L (ref 3.7–5.3)
Sodium: 135 mEq/L — ABNORMAL LOW (ref 137–147)

## 2013-01-16 LAB — CBC
HCT: 31.8 % — ABNORMAL LOW (ref 36.0–46.0)
Hemoglobin: 10.9 g/dL — ABNORMAL LOW (ref 12.0–15.0)
MCH: 33.2 pg (ref 26.0–34.0)
MCHC: 34.3 g/dL (ref 30.0–36.0)
MCV: 97 fL (ref 78.0–100.0)
Platelets: 205 10*3/uL (ref 150–400)
RBC: 3.28 MIL/uL — ABNORMAL LOW (ref 3.87–5.11)
RDW: 13.2 % (ref 11.5–15.5)
WBC: 6.5 10*3/uL (ref 4.0–10.5)

## 2013-01-16 MED ORDER — LEVOFLOXACIN IN D5W 750 MG/150ML IV SOLN
750.0000 mg | INTRAVENOUS | Status: DC
Start: 2013-01-17 — End: 2013-01-18
  Administered 2013-01-17: 750 mg via INTRAVENOUS
  Filled 2013-01-16: qty 150

## 2013-01-16 MED ORDER — IPRATROPIUM BROMIDE 0.02 % IN SOLN
0.5000 mg | Freq: Four times a day (QID) | RESPIRATORY_TRACT | Status: DC
  Administered 2013-01-16 – 2013-01-18 (×6): 0.5 mg via RESPIRATORY_TRACT
  Filled 2013-01-16 (×6): qty 2.5

## 2013-01-16 MED ORDER — LORAZEPAM 2 MG/ML IJ SOLN
0.5000 mg | INTRAMUSCULAR | Status: DC | PRN
  Administered 2013-01-16: 1 mg via INTRAVENOUS
  Administered 2013-01-16 – 2013-01-17 (×3): 0.5 mg via INTRAVENOUS
  Administered 2013-01-18: 1 mg via INTRAVENOUS
  Filled 2013-01-16 (×4): qty 1

## 2013-01-16 MED ORDER — LORAZEPAM 2 MG/ML IJ SOLN
0.5000 mg | Freq: Once | INTRAMUSCULAR | Status: AC
  Administered 2013-01-16: 0.5 mg via INTRAVENOUS
  Filled 2013-01-16: qty 1

## 2013-01-16 MED ORDER — ALBUTEROL SULFATE (2.5 MG/3ML) 0.083% IN NEBU
2.5000 mg | INHALATION_SOLUTION | Freq: Four times a day (QID) | RESPIRATORY_TRACT | Status: DC
  Administered 2013-01-16 – 2013-01-18 (×6): 2.5 mg via RESPIRATORY_TRACT
  Filled 2013-01-16 (×6): qty 3

## 2013-01-16 MED ORDER — LORAZEPAM 2 MG/ML IJ SOLN
INTRAMUSCULAR | Status: AC
  Filled 2013-01-16: qty 1

## 2013-01-16 MED ORDER — METHYLPREDNISOLONE SODIUM SUCC 125 MG IJ SOLR
60.0000 mg | Freq: Three times a day (TID) | INTRAMUSCULAR | Status: DC
  Administered 2013-01-16 – 2013-01-22 (×17): 60 mg via INTRAVENOUS
  Filled 2013-01-16: qty 0.96
  Filled 2013-01-16: qty 2
  Filled 2013-01-16 (×21): qty 0.96

## 2013-01-16 NOTE — Progress Notes (Signed)
[facility] [place] TEAM 1 - Stepdown/ICU TEAM  Donalynn FurlongLinda Jones Kaatz ZOX:[id number] DOB: 03/13/1935 DOA: 01/15/2013 PCP: Gweneth DimitriMCNEILL,WENDY, MD  Brief narrative: 78 year old female patient with chronic respiratory failure secondary to COPD. Failed outpatient treatment for presumed tracheobronchitis and COPD exacerbation (Z pak and steroids). Presented to the emergency department and due to her symptomatology was placed on BiPAP-was profoundly hypoxemic. Improved with BiPAP. Last hospitalized beginning of December 2012.  HPI/Subjective: Patient awake but restless, off BiPAP notably having difficulty breathing and seems slightly confused.  Admits that she has still been smoking.  Assessment/Plan:    Acute-on-chronic hypoxemic respiratory failure / COPD with acute exacerbation  -Labored effort off BiPAP so despite having 100% saturations on 40% while on BiPAP discussed with nurse and opted to place patient back on BiPAP and have respiratory therapy wean to lowest level of oxygen needed -Do to increased respiratory needs will have Foley catheter placed and reevaluate need every 24 hours -Continue supportive care -Continue maximal medical therapy   ? Hypertension -New diagnosis as outpatient? -Unclear why patient on diltiazem-possibly started by primary care physician recently -Not previously listed as a problem  DVT prophylaxis: Lovenox Code Status: Full Family Communication: No family at bedside Disposition Plan/Expected LOS: Remain in step down due to need for BiPAP  Consultants: None  Procedures: None  Antibiotics: Levaquin 1/10 >>>  Objective: Blood pressure 138/62, pulse 121, temperature 98.4 F (36.9 C), temperature source Oral, resp. rate 22, height 5\' 2"  (1.575 m), weight 119 lb 14.9 oz (54.4 kg), SpO2 100.00%.  Intake/Output Summary (Last 24 hours) at 01/16/13 1321 Last data filed at 01/16/13 1300  Gross per 24 hour  Intake   1720 ml  Output    675 ml  Net   1045  ml   Exam: General:modest resp distress - using accessory muscles  Lungs: Diffuse wheezing, labored respiratory effort with use of accessory muscles Cardiovascular: Regular rate and rhythm without murmur gallop or rub normal S1 and S2, no peripheral edema or JVD Abdomen: Nontender, nondistended, soft, bowel sounds positive, no rebound, no ascites, no appreciable mass Musculoskeletal: No significant cyanosis, clubbing of bilateral lower extremities  Scheduled Meds:  Scheduled Meds: . albuterol  2.5 mg Nebulization Q6H  . aspirin EC  81 mg Oral Daily  . atorvastatin  20 mg Oral q1800  . buPROPion  100 mg Oral Daily  . diltiazem  120 mg Oral Daily  . enoxaparin (LOVENOX) injection  40 mg Subcutaneous Q24H  . FLUoxetine  20 mg Oral Daily  . ipratropium  0.5 mg Nebulization Q6H  . isosorbide mononitrate  30 mg Oral Daily  . [START ON 01/17/2013] levofloxacin (LEVAQUIN) IV  750 mg Intravenous Q48H  . LORazepam      . methylPREDNISolone (SOLU-MEDROL) injection  80 mg Intravenous Q6H  . mometasone-formoterol  2 puff Inhalation BID  . pantoprazole  40 mg Oral Daily   Data Reviewed: Basic Metabolic Panel:  Recent Labs Lab 01/15/13 0513 01/15/13 1602 01/16/13 0338  NA 136*  --  135*  K 4.4  --  4.5  CL 97  --  96  CO2  --   --  28  GLUCOSE 136*  --  112*  BUN 16  --  20  CREATININE 0.60 0.50 0.55  CALCIUM  --   --  8.3*   Liver Function Tests: No results found for this basename: AST, ALT, ALKPHOS, BILITOT, PROT, ALBUMIN,  in the last 168 hours No results found for this basename: LIPASE, AMYLASE,  in the last 168 hours No results found for this basename: AMMONIA,  in the last 168 hours  CBC:  Recent Labs Lab 01/15/13 0505 01/15/13 0513 01/15/13 1602 01/16/13 0338  WBC 13.7*  --  9.9 6.5  HGB 13.8 15.0 12.8 10.9*  HCT 40.4 44.0 37.3 31.8*  MCV 98.1  --  98.4 97.0  PLT 262  --  243 205   CBG: No results found for this basename: GLUCAP,  in the last 168 hours  Recent  Results (from the past 240 hour(s))  MRSA PCR SCREENING     Status: None   Collection Time    01/15/13  3:06 PM      Result Value Range Status   MRSA by PCR NEGATIVE  NEGATIVE Final   Comment:            The GeneXpert MRSA Assay (FDA     approved for NASAL specimens     only), is one component of a     comprehensive MRSA colonization     surveillance program. It is not     intended to diagnose MRSA     infection nor to guide or     monitor treatment for     MRSA infections.  CULTURE, EXPECTORATED SPUTUM-ASSESSMENT     Status: None   Collection Time    01/15/13  3:54 PM      Result Value Range Status   Specimen Description SPUTUM   Final   Special Requests NONE   Final   Sputum evaluation     Final   Value: THIS SPECIMEN IS ACCEPTABLE. RESPIRATORY CULTURE REPORT TO FOLLOW.   Report Status 01/15/2013 FINAL   Final  CULTURE, RESPIRATORY (NON-EXPECTORATED)     Status: None   Collection Time    01/15/13  3:54 PM      Result Value Range Status   Specimen Description SPUTUM   Final   Special Requests NONE   Final   Gram Stain PENDING   Incomplete   Culture     Final   Value: NO GROWTH     Performed at [facility]   Report Status PENDING   Incomplete     Studies:  Recent x-ray studies have been reviewed in detail by the Attending Physician  Time spent : 35 mins   Junious Silk, ANP [facility] Office  [phone] Pager [phone]  **If unable to reach the above provider after paging please contact the Flow Manager @ [phone]  On-Call/Text Page:      [email or web address]      password [email or web address]  If 7PM-7AM, please contact night-coverage [email or web address] Password [email or web address] 01/16/2013, 1:21 PM   LOS: 1 day   I have personally examined this patient and reviewed the entire database. I have reviewed the above note, made any necessary editorial changes, and agree with its content.  Lonia Blood, MD [facility]

## 2013-01-16 NOTE — Progress Notes (Signed)
Pt again getting agitated. Trying to pull BIPAP off, Lenny Pastelom Callahan NP notified new orders received will continue to monitor

## 2013-01-16 NOTE — Progress Notes (Signed)
After Breathing treatment pt WOB not getting any better RT at the bedside and NP@ the bedside, pt placed on Bipap,  Ativan given for agitation, will continue to monitor.

## 2013-01-16 NOTE — Progress Notes (Signed)
Utilization review completed.  

## 2013-01-17 DIAGNOSIS — E860 Dehydration: Secondary | ICD-10-CM | POA: Diagnosis present

## 2013-01-17 DIAGNOSIS — Z720 Tobacco use: Secondary | ICD-10-CM | POA: Diagnosis present

## 2013-01-17 DIAGNOSIS — R34 Anuria and oliguria: Secondary | ICD-10-CM

## 2013-01-17 LAB — BLOOD GAS, ARTERIAL
Acid-Base Excess: 2.5 mmol/L — ABNORMAL HIGH (ref 0.0–2.0)
Bicarbonate: 27.4 mEq/L — ABNORMAL HIGH (ref 20.0–24.0)
DRAWN BY: 40415
Delivery systems: POSITIVE
EXPIRATORY PAP: 6
FIO2: 0.4 %
INSPIRATORY PAP: 12
Mode: POSITIVE
O2 Saturation: 99 %
PCO2 ART: 48.9 mmHg — AB (ref 35.0–45.0)
Patient temperature: 98.6
RATE: 12 resp/min
TCO2: 28.9 mmol/L (ref 0–100)
pH, Arterial: 7.367 (ref 7.350–7.450)
pO2, Arterial: 178 mmHg — ABNORMAL HIGH (ref 80.0–100.0)

## 2013-01-17 MED ORDER — SODIUM CHLORIDE 0.9 % IV BOLUS (SEPSIS)
500.0000 mL | Freq: Once | INTRAVENOUS | Status: AC
  Administered 2013-01-17: 500 mL via INTRAVENOUS

## 2013-01-17 NOTE — Progress Notes (Signed)
[facility] [place] TEAM 1 - Stepdown/ICU TEAM  Kaitlyn Snyder ZOX:[id number] DOB: [birth year] DOA: 01/15/2013 PCP: Gweneth Dimitri, MD  Brief narrative: 78 year old female patient with chronic respiratory failure secondary to COPD. Failed outpatient treatment for presumed tracheobronchitis and COPD exacerbation (Z pak and steroids). Presented to the emergency department and due to her symptomatology was placed on BiPAP-was profoundly hypoxemic. Improved with BiPAP. Last hospitalized beginning of December 2012.  HPI/Subjective: Patient awake less restless today but still somewhat confused.  Assessment/Plan:    Acute-on-chronic hypoxemic respiratory failure / COPD with acute exacerbation  -Had labored effort off BiPAP 1/12 so despite having 100% saturations on 40% while on BiPAP discussed with nurse and opted to place patient back on BiPAP and have respiratory therapy wean to lowest level of oxygen needed-attempted wean today but still required BiPAP although FIO2 down to 30%-last ABG with only minimal hypercarbia -Continue supportive care -Continue maximal medical therapy -suspect has pulmonary HTN and cor pulmonale so cont Imdur   Dehydration/low UOP -BP soft and now low UOP so give 500 cc NS bolus and increase IVFs to 100/hr- bladder scan with foley in place confirmed 0 cc urine in bladder -giving recent incr WOB likely has significant insensible losses thru resp tract   Hypertension -New diagnosis as outpatient -Recent start diltiazem by primary care physician  -Not previously listed as a problem  DVT prophylaxis: Lovenox Code Status: Full Family Communication: Telephone update 2 daughters Elita Quick and Eunice Blase on 01/17/2013 Disposition Plan/Expected LOS: Remain in step down due to need for BiPAP  Consultants: None  Procedures: None  Antibiotics: Levaquin 1/10 >>>  Objective: Blood pressure 116/45, pulse 106, temperature 98.2 F (36.8 C), temperature source Oral,  resp. rate 24, height 5\' 2"  (1.575 m), weight 119 lb 14.9 oz (54.4 kg), SpO2 99.00%.  Intake/Output Summary (Last 24 hours) at 01/17/13 1404 Last data filed at 01/17/13 1400  Gross per 24 hour  Intake   1185 ml  Output    655 ml  Net    530 ml   Exam: General: Improvement in modest resp distress - no using accessory muscles on BiPAP Lungs: Diffuse wheezing, labored respiratory effort with use of accessory muscles Cardiovascular: Regular rate and rhythm without murmur gallop or rub normal S1 and S2, no peripheral edema or JVD Abdomen: Nontender, nondistended, soft, bowel sounds positive, no rebound, no ascites, no appreciable mass Musculoskeletal: No significant cyanosis, clubbing of bilateral lower extremities  Scheduled Meds:  Scheduled Meds: . albuterol  2.5 mg Nebulization QID  . aspirin EC  81 mg Oral Daily  . atorvastatin  20 mg Oral q1800  . buPROPion  100 mg Oral Daily  . enoxaparin (LOVENOX) injection  40 mg Subcutaneous Q24H  . FLUoxetine  20 mg Oral Daily  . ipratropium  0.5 mg Nebulization QID  . isosorbide mononitrate  30 mg Oral Daily  . levofloxacin (LEVAQUIN) IV  750 mg Intravenous Q48H  . methylPREDNISolone (SOLU-MEDROL) injection  60 mg Intravenous Q8H  . pantoprazole  40 mg Oral Daily   Data Reviewed: Basic Metabolic Panel:  Recent Labs Lab 01/15/13 0513 01/15/13 1602 01/16/13 0338  NA 136*  --  135*  K 4.4  --  4.5  CL 97  --  96  CO2  --   --  28  GLUCOSE 136*  --  112*  BUN 16  --  20  CREATININE 0.60 0.50 0.55  CALCIUM  --   --  8.3*   Liver Function Tests: No  results found for this basename: AST, ALT, ALKPHOS, BILITOT, PROT, ALBUMIN,  in the last 168 hours No results found for this basename: LIPASE, AMYLASE,  in the last 168 hours No results found for this basename: AMMONIA,  in the last 168 hours  CBC:  Recent Labs Lab 01/15/13 0505 01/15/13 0513 01/15/13 1602 01/16/13 0338  WBC 13.7*  --  9.9 6.5  HGB 13.8 15.0 12.8 10.9*  HCT  40.4 44.0 37.3 31.8*  MCV 98.1  --  98.4 97.0  PLT 262  --  243 205   CBG: No results found for this basename: GLUCAP,  in the last 168 hours  Recent Results (from the past 240 hour(s))  MRSA PCR SCREENING     Status: None   Collection Time    01/15/13  3:06 PM      Result Value Range Status   MRSA by PCR NEGATIVE  NEGATIVE Final   Comment:            The GeneXpert MRSA Assay (FDA     approved for NASAL specimens     only), is one component of a     comprehensive MRSA colonization     surveillance program. It is not     intended to diagnose MRSA     infection nor to guide or     monitor treatment for     MRSA infections.  CULTURE, EXPECTORATED SPUTUM-ASSESSMENT     Status: None   Collection Time    01/15/13  3:54 PM      Result Value Range Status   Specimen Description SPUTUM   Final   Special Requests NONE   Final   Sputum evaluation     Final   Value: THIS SPECIMEN IS ACCEPTABLE. RESPIRATORY CULTURE REPORT TO FOLLOW.   Report Status 01/15/2013 FINAL   Final  CULTURE, RESPIRATORY (NON-EXPECTORATED)     Status: None   Collection Time    01/15/13  3:54 PM      Result Value Range Status   Specimen Description SPUTUM   Final   Special Requests NONE   Final   Gram Stain     Final   Value: NO WBC SEEN     NO SQUAMOUS EPITHELIAL CELLS SEEN     FEW GRAM POSITIVE COCCI IN PAIRS     [facility]   Culture     Final   Value: Culture reincubated for better growth     Performed at [facility]   Report Status PENDING   Incomplete     Studies:  Recent x-ray studies have been reviewed in detail by the Attending Physician  Time spent : 35 mins   Junious Silkllison Ellis, ANP [facility] Office  [phone] Pager [phone]  **If unable to reach the above provider after paging please contact the Flow Manager @ [phone]  On-Call/Text Page:      [email or web address] [email or web address]      password [email or web address]  If 7PM-7AM, please contact night-coverage [email or web address] Password  [email or web address] 01/17/2013, 2:04 PM   LOS: 2 days    I have examined the patient, reviewed the chart and modified the above note which I agree with.   Lulla Linville,MD [phone] 01/17/2013, 5:00 PM

## 2013-01-17 NOTE — Progress Notes (Signed)
Decreased urine output noted. MD notified and new orders received. Will continue to monitor.

## 2013-01-18 ENCOUNTER — Inpatient Hospital Stay (HOSPITAL_COMMUNITY): Payer: Medicare Other

## 2013-01-18 LAB — CBC
HCT: 35.2 % — ABNORMAL LOW (ref 36.0–46.0)
HEMOGLOBIN: 11.7 g/dL — AB (ref 12.0–15.0)
MCH: 33 pg (ref 26.0–34.0)
MCHC: 33.2 g/dL (ref 30.0–36.0)
MCV: 99.2 fL (ref 78.0–100.0)
PLATELETS: 188 10*3/uL (ref 150–400)
RBC: 3.55 MIL/uL — ABNORMAL LOW (ref 3.87–5.11)
RDW: 13.4 % (ref 11.5–15.5)
WBC: 7.3 10*3/uL (ref 4.0–10.5)

## 2013-01-18 LAB — BASIC METABOLIC PANEL
BUN: 25 mg/dL — AB (ref 6–23)
CALCIUM: 7.8 mg/dL — AB (ref 8.4–10.5)
CO2: 26 mEq/L (ref 19–32)
Chloride: 106 mEq/L (ref 96–112)
Creatinine, Ser: 0.47 mg/dL — ABNORMAL LOW (ref 0.50–1.10)
Glucose, Bld: 114 mg/dL — ABNORMAL HIGH (ref 70–99)
Potassium: 4.6 mEq/L (ref 3.7–5.3)
SODIUM: 142 meq/L (ref 137–147)

## 2013-01-18 LAB — POCT I-STAT 3, ART BLOOD GAS (G3+)
ACID-BASE EXCESS: 5 mmol/L — AB (ref 0.0–2.0)
Bicarbonate: 33.1 mEq/L — ABNORMAL HIGH (ref 20.0–24.0)
O2 SAT: 100 %
PO2 ART: 439 mmHg — AB (ref 80.0–100.0)
Patient temperature: 97.7
TCO2: 35 mmol/L (ref 0–100)
pCO2 arterial: 68.9 mmHg (ref 35.0–45.0)
pH, Arterial: 7.288 — ABNORMAL LOW (ref 7.350–7.450)

## 2013-01-18 LAB — URINALYSIS, ROUTINE W REFLEX MICROSCOPIC
BILIRUBIN URINE: NEGATIVE
Glucose, UA: NEGATIVE mg/dL
KETONES UR: 15 mg/dL — AB
Leukocytes, UA: NEGATIVE
NITRITE: NEGATIVE
PROTEIN: NEGATIVE mg/dL
Specific Gravity, Urine: 1.027 (ref 1.005–1.030)
Urobilinogen, UA: 1 mg/dL (ref 0.0–1.0)
pH: 6 (ref 5.0–8.0)

## 2013-01-18 LAB — CULTURE, RESPIRATORY W GRAM STAIN
Culture: NORMAL
Gram Stain: NONE SEEN

## 2013-01-18 LAB — URINE MICROSCOPIC-ADD ON

## 2013-01-18 LAB — MRSA PCR SCREENING: MRSA by PCR: NEGATIVE

## 2013-01-18 LAB — GLUCOSE, CAPILLARY
GLUCOSE-CAPILLARY: 103 mg/dL — AB (ref 70–99)
Glucose-Capillary: 100 mg/dL — ABNORMAL HIGH (ref 70–99)
Glucose-Capillary: 127 mg/dL — ABNORMAL HIGH (ref 70–99)

## 2013-01-18 LAB — CULTURE, RESPIRATORY

## 2013-01-18 MED ORDER — MIDAZOLAM HCL 2 MG/2ML IJ SOLN
INTRAMUSCULAR | Status: AC
  Administered 2013-01-18: 13:00:00 2 mg via INTRAVENOUS
  Filled 2013-01-18: qty 4

## 2013-01-18 MED ORDER — ETOMIDATE 2 MG/ML IV SOLN
INTRAVENOUS | Status: AC
  Administered 2013-01-18: 13:00:00
  Filled 2013-01-18: qty 10

## 2013-01-18 MED ORDER — ARFORMOTEROL TARTRATE 15 MCG/2ML IN NEBU
15.0000 ug | INHALATION_SOLUTION | Freq: Two times a day (BID) | RESPIRATORY_TRACT | Status: DC
  Administered 2013-01-18 – 2013-01-23 (×11): 15 ug via RESPIRATORY_TRACT
  Filled 2013-01-18 (×15): qty 2

## 2013-01-18 MED ORDER — IPRATROPIUM-ALBUTEROL 0.5-2.5 (3) MG/3ML IN SOLN: 3.0000 mL | Freq: Four times a day (QID) | RESPIRATORY_TRACT | Status: DC

## 2013-01-18 MED ORDER — FUROSEMIDE 10 MG/ML IJ SOLN
40.0000 mg | Freq: Once | INTRAMUSCULAR | Status: AC
  Administered 2013-01-18: 40 mg via INTRAVENOUS
  Filled 2013-01-18: qty 4

## 2013-01-18 MED ORDER — LEVOFLOXACIN 750 MG PO TABS: 750.0000 mg | ORAL_TABLET | ORAL | Status: DC

## 2013-01-18 MED ORDER — PANTOPRAZOLE SODIUM 40 MG IV SOLR
40.0000 mg | Freq: Every day | INTRAVENOUS | Status: DC
  Administered 2013-01-18 – 2013-01-19 (×2): 40 mg via INTRAVENOUS
  Filled 2013-01-18 (×2): qty 40

## 2013-01-18 MED ORDER — BIOTENE DRY MOUTH MT LIQD
15.0000 mL | Freq: Four times a day (QID) | OROMUCOSAL | Status: DC
  Administered 2013-01-18 – 2013-01-21 (×11): 15 mL via OROMUCOSAL

## 2013-01-18 MED ORDER — MIDAZOLAM HCL 2 MG/2ML IJ SOLN
1.0000 mg | INTRAMUSCULAR | Status: DC | PRN
  Administered 2013-01-18 – 2013-01-20 (×5): 1 mg via INTRAVENOUS
  Filled 2013-01-18 (×5): qty 2

## 2013-01-18 MED ORDER — SODIUM CHLORIDE 0.9 % IV SOLN
0.0000 ug/h | INTRAVENOUS | Status: DC
  Administered 2013-01-18: 100 ug/h via INTRAVENOUS
  Administered 2013-01-19: 50 ug/h via INTRAVENOUS
  Administered 2013-01-19: 150 ug/h via INTRAVENOUS
  Filled 2013-01-18 (×3): qty 50

## 2013-01-18 MED ORDER — CHLORHEXIDINE GLUCONATE 0.12 % MT SOLN
15.0000 mL | Freq: Two times a day (BID) | OROMUCOSAL | Status: DC
  Administered 2013-01-18 – 2013-01-20 (×5): 15 mL via OROMUCOSAL
  Filled 2013-01-18 (×4): qty 15

## 2013-01-18 MED ORDER — FENTANYL CITRATE 0.05 MG/ML IJ SOLN
INTRAMUSCULAR | Status: AC
  Administered 2013-01-18: 100 ug via INTRAVENOUS
  Filled 2013-01-18: qty 4

## 2013-01-18 MED ORDER — DEXTROSE 5 % IV SOLN
1.0000 g | Freq: Two times a day (BID) | INTRAVENOUS | Status: DC
  Administered 2013-01-18 – 2013-01-22 (×7): 1 g via INTRAVENOUS
  Filled 2013-01-18 (×10): qty 1

## 2013-01-18 MED ORDER — FENTANYL BOLUS VIA INFUSION
25.0000 ug | INTRAVENOUS | Status: DC | PRN
  Administered 2013-01-20: 50 ug via INTRAVENOUS
  Filled 2013-01-18: qty 50

## 2013-01-18 MED ORDER — SODIUM CHLORIDE 0.9 % IV SOLN
INTRAVENOUS | Status: DC
  Administered 2013-01-18: 75 mL/h via INTRAVENOUS
  Administered 2013-01-19 – 2013-01-22 (×3): via INTRAVENOUS

## 2013-01-18 MED ORDER — FENTANYL CITRATE 0.05 MG/ML IJ SOLN
100.0000 ug | Freq: Once | INTRAMUSCULAR | Status: AC
  Administered 2013-01-18: 100 ug via INTRAVENOUS

## 2013-01-18 MED ORDER — BUDESONIDE 0.5 MG/2ML IN SUSP
0.5000 mg | Freq: Two times a day (BID) | RESPIRATORY_TRACT | Status: DC
  Administered 2013-01-18 – 2013-01-23 (×11): 0.5 mg via RESPIRATORY_TRACT
  Filled 2013-01-18 (×15): qty 2

## 2013-01-18 MED ORDER — MIDAZOLAM HCL 2 MG/2ML IJ SOLN
2.0000 mg | Freq: Once | INTRAMUSCULAR | Status: AC
  Administered 2013-01-18: 2 mg via INTRAVENOUS

## 2013-01-18 MED ORDER — MAGIC MOUTHWASH W/LIDOCAINE
5.0000 mL | Freq: Four times a day (QID) | ORAL | Status: DC
  Administered 2013-01-18: 5 mL via ORAL
  Filled 2013-01-18 (×5): qty 5

## 2013-01-18 MED ORDER — FENTANYL CITRATE 0.05 MG/ML IJ SOLN: 50.0000 ug | Freq: Once | INTRAMUSCULAR | Status: AC

## 2013-01-18 MED ORDER — LEVOFLOXACIN IN D5W 750 MG/150ML IV SOLN
750.0000 mg | INTRAVENOUS | Status: DC
  Administered 2013-01-18 – 2013-01-21 (×4): 750 mg via INTRAVENOUS
  Filled 2013-01-18 (×6): qty 150

## 2013-01-18 NOTE — Consult Note (Signed)
Name: Kaitlyn Snyder MRN: [id number] DOB: [birth year]    ADMISSION DATE:  01/15/2013 CONSULTATION DATE:  01/18/13  REFERRING MD :  Sharon Seller PRIMARY SERVICE: Internal Medicine  CHIEF COMPLAINT:  Acute on Chronic Respiratory Failure  BRIEF PATIENT DESCRIPTION: 78 year old female with chronic respiratory failure secondary to COPD. Failed outpatient treatment for presumed tracheobronchitis and COPD exacerbation. Admitted to step down 1/11 for respiratory failure placed on BiPAP.Respiratory status worsened on 1/14. PCCM consulted.   SIGNIFICANT EVENTS / STUDIES:  1/11 Admitted to [facility] placed on BiPAP 1/14 Intubated, Transferred to ICU   LINES / TUBES: ETT -1/14 >>> [facility] - 1/14 >>>  CULTURES: 1/11 - Sputum Cx - Normal Flora 1/14 - Urine Cx >>> 1/14 respiratory culture>>> 1/14 viral screen>>>  ANTIBIOTICS: Levaquin 1/14 >>>  ceftaz 1/14>>>  HISTORY OF PRESENT ILLNESS:  78 year old female with chronic respiratory failure secondary to COPD. She uses home O2 at 3-5 liters. Recently failed outpatient treatment (azithro) for presumed tracheobronchitis and COPD exacerbation. Admitted to step down 1/11 for respiratory failure placed on BiPAP. She had some improvement in the following days and had some trials off of BiPAP which were unsuccessful. 1/14 her respiratory status began to decompensate while on BiPAP.  She became restless and remained confused. PCCM was asked to consult.  PAST MEDICAL HISTORY :  Past Medical History  Diagnosis Date  . COPD (chronic obstructive pulmonary disease)   . Hyperlipidemia   . Depression   . Squamous carcinoma   . Anxiety   . Anemia, B12 deficiency    Past Surgical History  Procedure Laterality Date  . Abdominal hysterectomy    . Cholecystectomy    . Gallbladder surgery     Prior to Admission medications   Medication Sig Start Date End Date Taking? Authorizing Provider  albuterol (PROVENTIL HFA;VENTOLIN HFA) 108 (90 BASE) MCG/ACT inhaler  Inhale 2 puffs into the lungs every 4 (four) hours as needed.     Yes Historical Provider, MD  albuterol (PROVENTIL) (2.5 MG/3ML) 0.083% nebulizer solution Take 3 mLs (2.5 mg total) by nebulization every 6 (six) hours as needed for wheezing or shortness of breath. 12/12/10 06/29/13 Yes Nishant Dhungel, MD  aspirin 81 MG tablet Take 81 mg by mouth daily.     Yes Historical Provider, MD  atorvastatin (LIPITOR) 20 MG tablet Take 20 mg by mouth daily.   Yes Historical Provider, MD  azithromycin (ZITHROMAX) 250 MG tablet Take as directed 10/06/12  Yes Waymon Budge, MD  buPROPion [facility] SR) 100 MG 12 hr tablet Take 1 tablet by mouth daily. 06/03/12  Yes Historical Provider, MD  Calcium Carbonate-Vitamin D (CALTRATE 600+D) 600-400 MG-UNIT per tablet Take 1 tablet by mouth 2 (two) times daily.     Yes Historical Provider, MD  cholecalciferol (VITAMIN D) 1000 UNITS tablet Take 4,000 Units by mouth daily.     Yes Historical Provider, MD  Cyanocobalamin (VITAMIN B 12 PO) Take 2,000 mg by mouth.     Yes Historical Provider, MD  diltiazem (DILACOR XR) 120 MG 24 hr capsule Take 120 mg by mouth daily.   Yes Historical Provider, MD  FLUoxetine (PROZAC) 20 MG tablet Take 20 mg by mouth daily.     Yes Historical Provider, MD  fluticasone-salmeterol (ADVAIR HFA) 115-21 MCG/ACT inhaler Inhale 2 puffs into the lungs 2 (two) times daily.   Yes Historical Provider, MD  ibandronate (BONIVA) 150 MG tablet Take 150 mg by mouth every 30 (thirty) days. Take in the morning with a full  glass of water, on an empty stomach, and do not take anything else by mouth or lie down for the next 30 min.   Yes Historical Provider, MD  isosorbide mononitrate (IMDUR) 30 MG 24 hr tablet Take 1 tablet by mouth daily. 06/03/12  Yes Historical Provider, MD  omeprazole (PRILOSEC) 20 MG capsule Take 20 mg by mouth 2 (two) times daily.     Yes Historical Provider, MD  predniSONE (DELTASONE) 20 MG tablet Take 20-60 mg by mouth daily. Take 3  tablets(60mg ) daily for 2 days, then take 2 tablets(40mg ) daily for 2 days, then take 1 tablet(20mg ) daily for 2 days   Yes Historical Provider, MD  tiotropium (SPIRIVA) 18 MCG inhalation capsule Place 18 mcg into inhaler and inhale daily.     Yes Historical Provider, MD  nitroGLYCERIN (NITROSTAT) 0.4 MG SL tablet Place 0.4 mg under the tongue every 5 (five) minutes as needed.      Historical Provider, MD   No Known Allergies  FAMILY HISTORY:  Family History  Problem Relation Age of Onset  . Kidney failure Brother   . Emphysema Father    SOCIAL HISTORY:  reports that she has been smoking Cigarettes.  She has a 27.5 pack-year smoking history. She has never used smokeless tobacco. She reports that she drinks about 3.6 ounces of alcohol per week. She reports that she does not use illicit drugs.  Review of Systems:  Unable due to confusion and respiratory status.   SUBJECTIVE:  Lethargic prior intubation  VITAL SIGNS: Temp:  [97.6 F (36.4 C)-98.7 F (37.1 C)] 98.7 F (37.1 C) (01/14 1206) Pulse Rate:  [88-111] 109 (01/14 0905) Resp:  [18-27] 19 (01/14 0905) BP: (117-130)/(57-67) 130/67 mmHg (01/14 1206) SpO2:  [95 %-99 %] 95 % (01/14 0905) FiO2 (%):  [30 %] 30 % (01/14 1206) HEMODYNAMICS:   VENTILATOR SETTINGS: Vent Mode:  [-]  FiO2 (%):  [30 %] 30 % INTAKE / OUTPUT: Intake/Output     01/13 0701 - 01/14 0700 01/14 0701 - 01/15 0700   I.V. (mL/kg) 1900 (34.9) 308.3 (5.7)   Total Intake(mL/kg) 1900 (34.9) 308.3 (5.7)   Urine (mL/kg/hr) 730 (0.6) 325 (1.1)   Total Output 730 325   Net +1170 -16.7          PHYSICAL EXAMINATION: General:  78 yo female in severe respiratory distress on BiPAP Neuro:  Awake, Alert, but confused.  HEENT:  Normocephalic, PERRL Cardiovascular:  Tachycardic, regular rhythm. No murmur, rub, gallop Lungs:  Diminished in bases. Abdomen:  Soft, non-tender, non-distended.  Musculoskeletal:  Intact Skin:  Intact  LABS:  CBC  Recent Labs Lab  01/15/13 1602 01/16/13 0338 01/18/13 0237  WBC 9.9 6.5 7.3  HGB 12.8 10.9* 11.7*  HCT 37.3 31.8* 35.2*  PLT 243 205 188   Coag's No results found for this basename: APTT, INR,  in the last 168 hours BMET  Recent Labs Lab 01/15/13 0513 01/15/13 1602 01/16/13 0338 01/18/13 0237  NA 136*  --  135* 142  K 4.4  --  4.5 4.6  CL 97  --  96 106  CO2  --   --  28 26  BUN 16  --  20 25*  CREATININE 0.60 0.50 0.55 0.47*  GLUCOSE 136*  --  112* 114*   Electrolytes  Recent Labs Lab 01/16/13 0338 01/18/13 0237  CALCIUM 8.3* 7.8*   Sepsis Markers No results found for this basename: LATICACIDVEN, PROCALCITON, O2SATVEN,  in the last 168 hours ABG  Recent Labs Lab 01/15/13 2140 01/16/13 0035 01/17/13 0205  PHART 7.317* 7.398 7.367  PCO2ART 57.3* 45.9* 48.9*  PO2ART 88.8 115.0* 178.0*   Liver Enzymes No results found for this basename: AST, ALT, ALKPHOS, BILITOT, ALBUMIN,  in the last 168 hours Cardiac Enzymes  Recent Labs Lab 01/15/13 0506  PROBNP 1564.0*   Glucose No results found for this basename: GLUCAP,  in the last 168 hours  Imaging No results found.   CXR: Pending  ASSESSMENT / PLAN:  PULMONARY A: Acute on Chronic Respiratory Failure  In setting of acute exacerbation of COPD r/o  Flu, also concerned about HCAP     P:   Mechanical ventilation with sedation Daily BD Antibiotic therapy Check ABG Systemic Corticosteroids Daily CXR Pulmonary hygeine Place ETT after d/w med poa. 8 cc/kg, avoid high rates, abg to follow Poor prognosis likely to liberate from vent given degree of terminal COPD   CARDIOVASCULAR A: Mild volume depletion  P:  IV hydration  Hold anithypertensives Supportive Care conisder cvp placement  RENAL A:Unclear volume status P:   Daily BMP cvp  GASTROINTESTINAL A:  No acute issue P:   PPI for prophylaxis Nutritional consult , start early TF  HEMATOLOGIC A:  No acute issue P:  Daily CBC Supportive  Care Bilateral SCDs and heparin for DVT prophylaxis.   INFECTIOUS A:  Possible flu  / viral infection on admission? R/o asp / hcap P:   Respiratory Viral Panel Urine, Blood, Sputum Cx Empiric ceftaz If drops BP, add vanc  ENDOCRINE A:  No Acute Issue P:   Supportive Care  NEUROLOGIC A:  Acute Encephalopathy in setting of respiratory failure P:   Supportive Care liklely to need versd to fent  TODAY'S SUMMARY:  Worsening respiratory failure in setting of slow to respond AECOPD. Worried she may have aspirated at this point. She is not a good candidate for mechanical ventilation. Have advised the daughter that she is at high risk for prolonged ventilation, and would not offer prolonged support. They want to talk about this further but we plan on revisiting goals of care.   I have personally obtained a history, examined the patient, evaluated laboratory and imaging results, formulated the assessment and plan and placed orders. CRITICAL CARE: The patient is critically ill with multiple organ systems failure and requires high complexity decision making for assessment and support, frequent evaluation and titration of therapies, application of advanced monitoring technologies and extensive interpretation of multiple databases. Critical Care Time devoted to patient care services described in this note is 45 minutes.    Joneen RoachPaul Hoffman NP Pulmonary and Critical Care Medicine [facility]  01/18/2013, 12:28 PM   Mcarthur Rossettianiel J. Tyson AliasFeinstein, MD, FACP Pgr: [phone] [facility]

## 2013-01-18 NOTE — Procedures (Signed)
    Central Venous Catheter Insertion Procedure Note Kaitlyn FurlongLinda Jones Snyder [id number] 09/09/1935  Procedure: Insertion of Central Venous Catheter Indications: Assessment of intravascular volume, Drug and/or fluid administration and Frequent blood sampling  Procedure Details Consent:Unable to obtain consent because of emergent medical necessity. Time Out: Verified patient identification, verified procedure, site/side was marked, verified correct patient position, special equipment/implants available, medications/allergies/relevent history reviewed, required imaging and test results available.  Performed Real time US was used to ID and cannulate the vessel  Maximum sterile technique was used including antiseptics, cap, gloves, gown, hand hygiene, mask and sheet. Skin prep: Chlorhexidine; local anesthetic administered A antimicrobial bonded/coated triple lumen catheter was placed in the left internal jugular vein using the Seldinger technique.  Evaluation Blood flow good Complications: No apparent complications Patient did tolerate procedure well. Chest X-ray ordered to verify placement.  CXR: pending.  BABCOCK,PETE 01/18/2013, 1:46 PM  US guidance toleratwd well Mcarthur Rossettianiel J. Tyson AliasFeinstein, MD, FACP Pgr: [phone] [facility]

## 2013-01-18 NOTE — Progress Notes (Signed)
Gave report to LeandoMelinda, 2100 RN. Pt will transfer to ICU if family decides to proceed with intubation. Cindee LamePete, NP is speaking to family now.

## 2013-01-18 NOTE — Progress Notes (Signed)
ANTIBIOTIC CONSULT NOTE - INITIAL  Pharmacy Consult for Ceftazidime Indication: pneumonia  No Known Allergies  Patient Measurements: Height: 5\' 2"  (157.5 cm) Weight: 122 lb 5.7 oz (55.5 kg) IBW/kg (Calculated) : 50.1  Vital Signs: Temp: 98.7 F (37.1 C) (01/14 1206) Temp src: Oral (01/14 1206) BP: 130/67 mmHg (01/14 1206) Pulse Rate: 109 (01/14 0905) Intake/Output from previous day: 01/13 0701 - 01/14 0700 In: 1900 [I.V.:1900] Out: 730 [Urine:730] Intake/Output from this shift: Total I/O In: 308.3 [I.V.:308.3] Out: 325 [Urine:325]  Labs:  Recent Labs  01/15/13 1602 01/16/13 0338 01/18/13 0237  WBC 9.9 6.5 7.3  HGB 12.8 10.9* 11.7*  PLT 243 205 188  CREATININE 0.50 0.55 0.47*   Estimated Creatinine Clearance: 48.1 ml/min (by C-G formula based on Cr of 0.47). No results found for this basename: VANCOTROUGH, Leodis Binet, VANCORANDOM, GENTTROUGH, GENTPEAK, GENTRANDOM, TOBRATROUGH, TOBRAPEAK, TOBRARND, AMIKACINPEAK, AMIKACINTROU, AMIKACIN,  in the last 72 hours   Microbiology: Recent Results (from the past 720 hour(s))  MRSA PCR SCREENING     Status: None   Collection Time    01/15/13  3:06 PM      Result Value Range Status   MRSA by PCR NEGATIVE  NEGATIVE Final   Comment:            The GeneXpert MRSA Assay (FDA     approved for NASAL specimens     only), is one component of a     comprehensive MRSA colonization     surveillance program. It is not     intended to diagnose MRSA     infection nor to guide or     monitor treatment for     MRSA infections.  CULTURE, EXPECTORATED SPUTUM-ASSESSMENT     Status: None   Collection Time    01/15/13  3:54 PM      Result Value Range Status   Specimen Description SPUTUM   Final   Special Requests NONE   Final   Sputum evaluation     Final   Value: THIS SPECIMEN IS ACCEPTABLE. RESPIRATORY CULTURE REPORT TO FOLLOW.   Report Status 01/15/2013 FINAL   Final  CULTURE, RESPIRATORY (NON-EXPECTORATED)     Status: None   Collection Time    01/15/13  3:54 PM      Result Value Range Status   Specimen Description SPUTUM   Final   Special Requests NONE   Final   Gram Stain     Final   Value: NO WBC SEEN     NO SQUAMOUS EPITHELIAL CELLS SEEN     FEW GRAM POSITIVE COCCI IN PAIRS     Performed at [facility]   Culture     Final   Value: NORMAL OROPHARYNGEAL FLORA     Performed at [facility]   Report Status 01/18/2013 FINAL   Final    Medical History: Past Medical History  Diagnosis Date  . COPD (chronic obstructive pulmonary disease)   . Hyperlipidemia   . Depression   . Squamous carcinoma   . Anxiety   . Anemia, B12 deficiency     Medications:  Prescriptions prior to admission  Medication Sig Dispense Refill  . albuterol (PROVENTIL HFA;VENTOLIN HFA) 108 (90 BASE) MCG/ACT inhaler Inhale 2 puffs into the lungs every 4 (four) hours as needed.        Marland Kitchen albuterol (PROVENTIL) (2.5 MG/3ML) 0.083% nebulizer solution Take 3 mLs (2.5 mg total) by nebulization every 6 (six) hours as needed for wheezing or shortness of  breath.  75 mL  3  . aspirin 81 MG tablet Take 81 mg by mouth daily.        Marland Kitchen. atorvastatin (LIPITOR) 20 MG tablet Take 20 mg by mouth daily.      Marland Kitchen. azithromycin (ZITHROMAX) 250 MG tablet Take as directed  6 each  0  . buPROPion (WELLBUTRIN SR) 100 MG 12 hr tablet Take 1 tablet by mouth daily.      . Calcium Carbonate-Vitamin D (CALTRATE 600+D) 600-400 MG-UNIT per tablet Take 1 tablet by mouth 2 (two) times daily.        . cholecalciferol (VITAMIN D) 1000 UNITS tablet Take 4,000 Units by mouth daily.        . Cyanocobalamin (VITAMIN B 12 PO) Take 2,000 mg by mouth.        . diltiazem (DILACOR XR) 120 MG 24 hr capsule Take 120 mg by mouth daily.      Marland Kitchen. FLUoxetine (PROZAC) 20 MG tablet Take 20 mg by mouth daily.        . fluticasone-salmeterol (ADVAIR HFA) 115-21 MCG/ACT inhaler Inhale 2 puffs into the lungs 2 (two) times daily.      Marland Kitchen. ibandronate (BONIVA) 150 MG tablet Take 150  mg by mouth every 30 (thirty) days. Take in the morning with a full glass of water, on an empty stomach, and do not take anything else by mouth or lie down for the next 30 min.      . isosorbide mononitrate (IMDUR) 30 MG 24 hr tablet Take 1 tablet by mouth daily.      Marland Kitchen. omeprazole (PRILOSEC) 20 MG capsule Take 20 mg by mouth 2 (two) times daily.        . predniSONE (DELTASONE) 20 MG tablet Take 20-60 mg by mouth daily. Take 3 tablets(60mg ) daily for 2 days, then take 2 tablets(40mg ) daily for 2 days, then take 1 tablet(20mg ) daily for 2 days      . tiotropium (SPIRIVA) 18 MCG inhalation capsule Place 18 mcg into inhaler and inhale daily.        . nitroGLYCERIN (NITROSTAT) 0.4 MG SL tablet Place 0.4 mg under the tongue every 5 (five) minutes as needed.         Assessment: 78 y.o. F with chronic resp failure due to COPD. Admitted to SDU 1/11 w/ resp failure- on bipap. Transferred to ICU 1/14 with inability to wean from bipap. Pt on Levaquin Day#4, Ceftazidime Day#1 for PNA. Failed o/p tx w/ presumed tracheobronchitis/COPD exac (Zpak/steroids). MRSA PCR neg. Wbc wnl. Afeb.   1/11 Levaquin>> 1/14 Ceftaz>>  1/11 sputum>>nl flora 1/14 urine 1/14 sputum 1/14 RSV  Goal of Therapy:  Eradication of infection  Plan:  Continue Levaquin 750mg  IV q48h Ceftazidime 1gm IV q12h F/u micro data, renal function, pt's clinical condition  Christoper Fabianaron Pandora Mccrackin, PharmD, BCPS Clinical pharmacist, pager [phone] 01/18/2013,1:31 PM

## 2013-01-18 NOTE — Progress Notes (Signed)
[facility] TEAM 1 - Stepdown/ICU TEAM  Donalynn FurlongLinda Jones [place] WUJ:[id number] DOB: 06/16/1935 DOA: 01/15/2013 PCP: Gweneth DimitriMCNEILL,WENDY, MD  Brief narrative: 78 year old female patient with chronic respiratory failure secondary to COPD. Failed outpatient treatment for presumed tracheobronchitis and COPD exacerbation (Z pak and steroids). Presented to the emergency department and due to her symptomatology was placed on BiPAP-was profoundly hypoxemic. Improved with BiPAP. Last hospitalized beginning of December 2012.  HPI/Subjective: Patient awake less restless today but still somewhat confused.  Assessment/Plan:    Acute-on-chronic hypoxemic respiratory failure / COPD with acute exacerbation  -Had labored effort off BiPAP 1/12 so despite having 100% saturations on 40% while on BiPAP discussed with nurse and opted to place patient back on BiPAP and have respiratory therapy wean to lowest level of oxygen needed-attempted wean today but still required BiPAP although FIO2 down to 30%-last ABG with only minimal hypercarbia -has not progressed and unable to wean from BiPAP so pulmonary consulted- left msg for dtr Deb [phone] explaining need for pulmonary eval- in the interim pt has been seen by PCCM and has subsequently been txd to ICU for mech ventilation. -Continue supportive care -Continue maximal medical therapy -suspect has pulmonary HTN and cor pulmonale so cont Imdur   Dehydration/low UOP -BP soft and w/ low UOP 1/13 so was so given 500 cc NS bolus and increased IVFs to 100/hr- (bladder scan with foley in place confirmed 0 cc urine in bladder 1/13) -giving recent incr WOB likely has significant insensible losses thru resp tract   Hypertension -New diagnosis as outpatient -Recent start diltiazem by primary care physician -dcd 1/13 given soft BP -Not previously listed as a problem  DVT prophylaxis: Lovenox Code Status: Full Family Communication: Telephone update 2 daughters Elita Quickam  and Eunice BlaseDebbie on 01/17/2013 Disposition Plan/Expected LOS: Transfer to ICU  Consultants: None  Procedures: None  Antibiotics: Levaquin 1/10 >>>  Objective: Blood pressure 130/67, pulse 109, temperature 98.7 F (37.1 C), temperature source Oral, resp. rate 19, height 5\' 2"  (1.575 m), weight 122 lb 5.7 oz (55.5 kg), SpO2 95.00%.  Intake/Output Summary (Last 24 hours) at 01/18/13 1316 Last data filed at 01/18/13 1100  Gross per 24 hour  Intake 1908.33 ml  Output    900 ml  Net 1008.33 ml   Exam: General: Has worsened and once again using accessory muscles to breath-labored Lungs: Diffuse wheezing, labored respiratory effort with use of accessory muscles-dats with attempts to remove BiPAP Cardiovascular: Regular tachycardic rate and rhythm without murmur gallop or rub normal S1 and S2, no peripheral edema or JVD Abdomen: Nontender, nondistended, soft, bowel sounds positive, no rebound, no ascites, no appreciable mass Musculoskeletal: No significant cyanosis, clubbing of bilateral lower extremities  Scheduled Meds:  Scheduled Meds: . antiseptic oral rinse  15 mL Mouth Rinse QID  . aspirin EC  81 mg Oral Daily  . atorvastatin  20 mg Oral q1800  . buPROPion  100 mg Oral Daily  . chlorhexidine  15 mL Mouth Rinse BID  . enoxaparin (LOVENOX) injection  40 mg Subcutaneous Q24H  . etomidate      . fentaNYL  50 mcg Intravenous Once  . FLUoxetine  20 mg Oral Daily  . ipratropium-albuterol  3 mL Nebulization QID  . isosorbide mononitrate  30 mg Oral Daily  . [START ON 01/19/2013] levofloxacin  750 mg Oral Q48H  . methylPREDNISolone (SOLU-MEDROL) injection  60 mg Intravenous Q8H  . pantoprazole (PROTONIX) IV  40 mg Intravenous Daily   Data Reviewed: Basic Metabolic Panel:  Recent Labs Lab 01/15/13 0513 01/15/13 1602 01/16/13 0338 01/18/13 0237  NA 136*  --  135* 142  K 4.4  --  4.5 4.6  CL 97  --  96 106  CO2  --   --  28 26  GLUCOSE 136*  --  112* 114*  BUN 16  --  20 25*   CREATININE 0.60 0.50 0.55 0.47*  CALCIUM  --   --  8.3* 7.8*   Liver Function Tests: No results found for this basename: AST, ALT, ALKPHOS, BILITOT, PROT, ALBUMIN,  in the last 168 hours No results found for this basename: LIPASE, AMYLASE,  in the last 168 hours No results found for this basename: AMMONIA,  in the last 168 hours  CBC:  Recent Labs Lab 01/15/13 0505 01/15/13 0513 01/15/13 1602 01/16/13 0338 01/18/13 0237  WBC 13.7*  --  9.9 6.5 7.3  HGB 13.8 15.0 12.8 10.9* 11.7*  HCT 40.4 44.0 37.3 31.8* 35.2*  MCV 98.1  --  98.4 97.0 99.2  PLT 262  --  243 205 188   CBG: No results found for this basename: GLUCAP,  in the last 168 hours  Recent Results (from the past 240 hour(s))  MRSA PCR SCREENING     Status: None   Collection Time    01/15/13  3:06 PM      Result Value Range Status   MRSA by PCR NEGATIVE  NEGATIVE Final   Comment:            The GeneXpert MRSA Assay (FDA     approved for NASAL specimens     only), is one component of a     comprehensive MRSA colonization     surveillance program. It is not     intended to diagnose MRSA     infection nor to guide or     monitor treatment for     MRSA infections.  CULTURE, EXPECTORATED SPUTUM-ASSESSMENT     Status: None   Collection Time    01/15/13  3:54 PM      Result Value Range Status   Specimen Description SPUTUM   Final   Special Requests NONE   Final   Sputum evaluation     Final   Value: THIS SPECIMEN IS ACCEPTABLE. RESPIRATORY CULTURE REPORT TO FOLLOW.   Report Status 01/15/2013 FINAL   Final  CULTURE, RESPIRATORY (NON-EXPECTORATED)     Status: None   Collection Time    01/15/13  3:54 PM      Result Value Range Status   Specimen Description SPUTUM   Final   Special Requests NONE   Final   Gram Stain     Final   Value: NO WBC SEEN     NO SQUAMOUS EPITHELIAL CELLS SEEN     FEW GRAM POSITIVE COCCI IN PAIRS     Performed at [facility]   Culture     Final   Value: NORMAL OROPHARYNGEAL  FLORA     Performed at [facility]   Report Status 01/18/2013 FINAL   Final     Studies:  Recent x-ray studies have been reviewed in detail by the Attending Physician  Time spent : 35 mins   Junious Silk, ANP [facility] Office  [phone] Pager [phone]  **If unable to reach the above provider after paging please contact the Flow Manager @ [phone]  On-Call/Text Page:      [email or web address]      password [email or web address]  If  7PM-7AM, please contact night-coverage [email or web address] Password [email or web address] 01/18/2013, 1:16 PM   LOS: 3 days    I have personally examined this patient and reviewed the entire database. I have reviewed the above note, made any necessary editorial changes, and agree with its content.  Lonia Blood, MD [facility]

## 2013-01-18 NOTE — Progress Notes (Signed)
Brief Interval Progress Note  This evening, the patient has been noted by nursing to have breath stacking on her ventilator, as well as increasing tachycardia to the 130's, thought to represent agitation/vent dyssynchrony.  Currently on Fentanyl drip at 100.  Received 1 dose of versed earlier today, but no other sedation medications ordered. -start Versed 1 mg q2hrs prn for sedation/vent synchrony   Signed Janalyn Harderyan Eyla Tallon, PGY3 Pgr. [phone] 01/18/2013, 10:56 PM

## 2013-01-18 NOTE — Progress Notes (Signed)
Oral thrush noted. Low urine output. Assessments called to Dr Delford FieldWright. See new orders.

## 2013-01-18 NOTE — Progress Notes (Signed)
Transferred to 69M via bed with non re-breather. Family updated of plan.

## 2013-01-18 NOTE — Procedures (Signed)
    Intubation Procedure Note Kaitlyn FurlongLinda Jones Snyder [id number] 04/17/1935  Procedure: Intubation Indications: Respiratory insufficiency  Procedure Details Consent: Unable to obtain consent because of altered level of consciousness. Time Out: Verified patient identification, verified procedure, site/side was marked, verified correct patient position, special equipment/implants available, medications/allergies/relevent history reviewed, required imaging and test results available.  Performed  Maximum sterile technique was used including cap, gloves, gown, hand hygiene and mask.  MAC 3 glide scope   Evaluation Hemodynamic Status: BP stable throughout; O2 sats: stable throughout Patient's Current Condition: stable Complications: No apparent complications Patient did tolerate procedure well. Chest X-ray ordered to verify placement.  CXR: pending.   Snyder,Kaitlyn 01/18/2013 Tolerated ell [place], asp likely over last few days  Snyder Kaitlyn Snyder AliasFeinstein, MD, FACP Pgr: [phone] [facility]

## 2013-01-18 NOTE — Progress Notes (Signed)
INITIAL NUTRITION ASSESSMENT  DOCUMENTATION CODES Per approved criteria  -Not Applicable   INTERVENTION: Diet advancement vs initiation of nutrition support, per team. RD to continue to follow nutrition care plan.  NUTRITION DIAGNOSIS: Inadequate oral intake related to inability to eat as evidenced by NPO status.   Goal: Diet advancement vs initiation of nutrition support.  Monitor:  weight trends, lab trends, I/O's, respiratory status, advancement of diet vs initiation of nutrition support  Reason for Assessment: Malnutrition Screening Tool  78 y.o. female  Admitting Dx: COPD exacerbation  ASSESSMENT: PMHx significant for COPD. Admitted with SOB x 5 days and production of green sputum. Active smoker. Work-up reveals COPD exacerbation. Flu negative.  Pt currently NPO and being transferred to ICU with non-rebreather.  Per chart, pt was placed on a Regular diet 3 days ago, made NPO this afternoon with onset of respiratory distress. No PO intake information has been completed since admission. Pt unable to answer my questions and no family at bedside. Also unable to complete physical exam. Pt with 4% wt loss x 6 months - this is not significant for this time frame.  Patient's last BM was 4 days ago.  Pt is at nutrition risk 2/2 hx of weight loss and current acute on chronic medical issues.  Height: Ht Readings from Last 1 Encounters:  01/15/13 5\' 2"  (1.575 m)    Weight: Wt Readings from Last 1 Encounters:  01/15/13 119 lb 14.9 oz (54.4 kg)    Ideal Body Weight: 110 lb  % Ideal Body Weight: 108%  Wt Readings from Last 10 Encounters:  01/15/13 119 lb 14.9 oz (54.4 kg)  06/29/12 124 lb (56.246 kg)  06/22/11 137 lb 3.2 oz (62.234 kg)  12/24/10 133 lb 6.4 oz (60.51 kg)  12/07/10 133 lb 6.1 oz (60.5 kg)    Usual Body Weight: 125 lb  % Usual Body Weight: 95%  BMI:  Body mass index is 21.93 kg/(m^2). Normal weight  Estimated Nutritional Needs: Kcal: 1500 -  1700 Protein: 80 - 95 g Fluid: 1.5 - 1.7 liters daily  Skin: intact  Diet Order:    EDUCATION NEEDS: -No education needs identified at this time   Intake/Output Summary (Last 24 hours) at 01/18/13 1204 Last data filed at 01/18/13 1100  Gross per 24 hour  Intake 1958.33 ml  Output    900 ml  Net 1058.33 ml    Last BM: 1/10   Labs:   Recent Labs Lab 01/15/13 0513 01/15/13 1602 01/16/13 0338 01/18/13 0237  NA 136*  --  135* 142  K 4.4  --  4.5 4.6  CL 97  --  96 106  CO2  --   --  28 26  BUN 16  --  20 25*  CREATININE 0.60 0.50 0.55 0.47*  CALCIUM  --   --  8.3* 7.8*  GLUCOSE 136*  --  112* 114*    CBG (last 3)  No results found for this basename: GLUCAP,  in the last 72 hours  Scheduled Meds: . aspirin EC  81 mg Oral Daily  . atorvastatin  20 mg Oral q1800  . buPROPion  100 mg Oral Daily  . enoxaparin (LOVENOX) injection  40 mg Subcutaneous Q24H  . FLUoxetine  20 mg Oral Daily  . ipratropium-albuterol  3 mL Nebulization QID  . isosorbide mononitrate  30 mg Oral Daily  . [START ON 01/19/2013] levofloxacin  750 mg Oral Q48H  . methylPREDNISolone (SOLU-MEDROL) injection  60 mg Intravenous Q8H  .  pantoprazole  40 mg Oral Daily    Continuous Infusions: . sodium chloride 100 mL (01/17/13 1317)    Past Medical History  Diagnosis Date  . COPD (chronic obstructive pulmonary disease)   . Hyperlipidemia   . Depression   . Squamous carcinoma   . Anxiety   . Anemia, B12 deficiency     Past Surgical History  Procedure Laterality Date  . Abdominal hysterectomy    . Cholecystectomy    . Gallbladder surgery      Jarold MottoSamantha Bellamie Turney MS, RD, LDN Pager: [phone] After-hours pager: [phone]

## 2013-01-19 ENCOUNTER — Inpatient Hospital Stay (HOSPITAL_COMMUNITY): Payer: Medicare Other

## 2013-01-19 DIAGNOSIS — F17200 Nicotine dependence, unspecified, uncomplicated: Secondary | ICD-10-CM

## 2013-01-19 LAB — BLOOD GAS, ARTERIAL
Acid-Base Excess: 6.7 mmol/L — ABNORMAL HIGH (ref 0.0–2.0)
Bicarbonate: 31.2 mEq/L — ABNORMAL HIGH (ref 20.0–24.0)
Drawn by: 40530
FIO2: 0.4 %
MECHVT: 500 mL
O2 Saturation: 96.2 %
PATIENT TEMPERATURE: 98.6
PEEP: 5 cmH2O
RATE: 20 resp/min
TCO2: 32.7 mmol/L (ref 0–100)
pCO2 arterial: 48.8 mmHg — ABNORMAL HIGH (ref 35.0–45.0)
pH, Arterial: 7.422 (ref 7.350–7.450)
pO2, Arterial: 84.7 mmHg (ref 80.0–100.0)

## 2013-01-19 LAB — RESPIRATORY VIRUS PANEL
Adenovirus: NOT DETECTED
Influenza A H1: NOT DETECTED
Influenza A H3: NOT DETECTED
Influenza A: NOT DETECTED
Influenza B: NOT DETECTED
Metapneumovirus: NOT DETECTED
PARAINFLUENZA 1 A: NOT DETECTED
PARAINFLUENZA 2 A: NOT DETECTED
Parainfluenza 3: NOT DETECTED
Respiratory Syncytial Virus A: NOT DETECTED
Respiratory Syncytial Virus B: NOT DETECTED
Rhinovirus: NOT DETECTED

## 2013-01-19 LAB — COMPREHENSIVE METABOLIC PANEL
ALT: 60 U/L — ABNORMAL HIGH (ref 0–35)
AST: 43 U/L — AB (ref 0–37)
Albumin: 2.7 g/dL — ABNORMAL LOW (ref 3.5–5.2)
Alkaline Phosphatase: 54 U/L (ref 39–117)
BUN: 25 mg/dL — AB (ref 6–23)
CALCIUM: 7.8 mg/dL — AB (ref 8.4–10.5)
CO2: 31 meq/L (ref 19–32)
CREATININE: 0.55 mg/dL (ref 0.50–1.10)
Chloride: 106 mEq/L (ref 96–112)
GFR calc Af Amer: >90 mL/min (ref >90)
GFR calc non Af Amer: 89 mL/min — ABNORMAL LOW (ref >90)
Glucose, Bld: 128 mg/dL — ABNORMAL HIGH (ref 70–99)
Potassium: 4.4 mEq/L (ref 3.7–5.3)
Sodium: 146 mEq/L (ref 137–147)
Total Bilirubin: 0.6 mg/dL (ref 0.3–1.2)
Total Protein: 4.9 g/dL — ABNORMAL LOW (ref 6.0–8.3)

## 2013-01-19 LAB — GLUCOSE, CAPILLARY
Glucose-Capillary: 134 mg/dL — ABNORMAL HIGH (ref 70–99)
Glucose-Capillary: 127 mg/dL — ABNORMAL HIGH (ref 70–99)
Glucose-Capillary: 138 mg/dL — ABNORMAL HIGH (ref 70–99)
Glucose-Capillary: 127 mg/dL — ABNORMAL HIGH (ref 70–99)
Glucose-Capillary: 123 mg/dL — ABNORMAL HIGH (ref 70–99)
Glucose-Capillary: 121 mg/dL — ABNORMAL HIGH (ref 70–99)

## 2013-01-19 LAB — CBC
HEMATOCRIT: 36.2 % (ref 36.0–46.0)
Hemoglobin: 11.8 g/dL — ABNORMAL LOW (ref 12.0–15.0)
MCH: 33 pg (ref 26.0–34.0)
MCHC: 32.6 g/dL (ref 30.0–36.0)
MCV: 101.1 fL — AB (ref 78.0–100.0)
PLATELETS: 176 10*3/uL (ref 150–400)
RBC: 3.58 MIL/uL — AB (ref 3.87–5.11)
RDW: 13.6 % (ref 11.5–15.5)
WBC: 7.1 10*3/uL (ref 4.0–10.5)

## 2013-01-19 LAB — URINE CULTURE
COLONY COUNT: NO GROWTH
Culture: NO GROWTH
Special Requests: NORMAL

## 2013-01-19 MED ORDER — PANTOPRAZOLE SODIUM 40 MG PO PACK
40.0000 mg | PACK | Freq: Every day | ORAL | Status: DC
  Administered 2013-01-20 – 2013-01-21 (×2): 40 mg
  Filled 2013-01-19 (×3): qty 20

## 2013-01-19 MED ORDER — DILTIAZEM HCL 30 MG PO TABS
30.0000 mg | ORAL_TABLET | Freq: Four times a day (QID) | ORAL | Status: DC
  Administered 2013-01-19 – 2013-01-20 (×5): 30 mg via ORAL
  Filled 2013-01-19 (×8): qty 1

## 2013-01-19 MED ORDER — VITAL AF 1.2 CAL PO LIQD: 1000.0000 mL | ORAL | Status: DC

## 2013-01-19 MED ORDER — ASPIRIN EC 81 MG PO TBEC
81.0000 mg | DELAYED_RELEASE_TABLET | Freq: Every day | ORAL | Status: DC
  Administered 2013-01-19 – 2013-01-24 (×6): 81 mg via ORAL
  Filled 2013-01-19 (×6): qty 1

## 2013-01-19 MED ORDER — VITAL AF 1.2 CAL PO LIQD
1000.0000 mL | ORAL | Status: DC
  Administered 2013-01-19: 1000 mL
  Filled 2013-01-19 (×5): qty 1000

## 2013-01-19 MED ORDER — ATORVASTATIN CALCIUM 20 MG PO TABS
20.0000 mg | ORAL_TABLET | Freq: Every day | ORAL | Status: DC
  Administered 2013-01-19: 20 mg
  Filled 2013-01-19 (×5): qty 1

## 2013-01-19 MED ORDER — FLUCONAZOLE 100MG IVPB
100.0000 mg | Freq: Once | INTRAVENOUS | Status: AC
Start: 2013-01-19 — End: 2013-01-19
  Administered 2013-01-19: 100 mg via INTRAVENOUS
  Filled 2013-01-19: qty 50

## 2013-01-19 NOTE — Progress Notes (Signed)
NUTRITION FOLLOW UP  DOCUMENTATION CODES  Per approved criteria   -Non-severe (moderate) malnutrition in the context of chronic illness.   Intervention:    Initiate TF via OGT with Vital AF 1.2 at 20 ml/h, increase by 10 ml every 4 hours to goal rate of 50 ml/h to provide 1440 kcals, 90 gm protein, 973 ml free water daily.  Nutrition Dx:   Inadequate oral intake related to inability to eat as evidenced by NPO status. Ongoing.  Goal:   Intake to meet >90% of estimated nutrition needs. Unmet.  Monitor:   TF tolerance/adequacy, weight trend, labs, vent status.  Assessment:   PMHx significant for COPD. Admitted with SOB x 5 days and production of green sputum. Active smoker. Work-up reveals COPD exacerbation.    Patient was transferred to the ICU and intubated on 1/15.  Patient is currently intubated on ventilator support. Discussed patient in ICU rounds today. No plans for extubation today, will re-evaluate tomorrow. RD to initiate TF today.  Family in room with patient reports her usual weight is 125 lb and that she has been eating very poorly for the past few months. Patient with 5% weight loss over the past month.  Nutrition Focused Physical Exam:  Subcutaneous Fat:  Orbital Region: WNL Upper Arm Region: WNL Thoracic and Lumbar Region: NA  Muscle:  Temple Region: WNL Clavicle Bone Region: mild-moderate depletion Clavicle and Acromion Bone Region: mild-moderate depletion Scapular Bone Region: NA Dorsal Hand: mild-moderate depletion Patellar Region: mild-moderate depletion Anterior Thigh Region: mild-moderate depletion Posterior Calf Region: mild-moderate depletion  Edema: none  Pt meets criteria for non-severe (moderate) MALNUTRITION in the context of chronic illness as evidenced by 5% weight loss in 1 month, intake < 75% of estimated energy requirement for > 1 month, and mild depletion of muscle mass.  MV: 10.4 L/min Temp (24hrs), Avg:98.7 F (37.1 C), Min:97.7 F  (36.5 C), Max:100.8 F (38.2 C)   Height: Ht Readings from Last 1 Encounters:  01/18/13 5\' 2"  (1.575 m)    Weight Status:   Wt Readings from Last 1 Encounters:  01/18/13 122 lb 5.7 oz (55.5 kg)  01/15/13  119 lb 14.9 oz (54.4 kg)   Re-estimated needs:  Kcal: 1450 Protein: 80-95 gm Fluid: 1.4-1.6 L  Skin: intact  Diet Order: NPO   Intake/Output Summary (Last 24 hours) at 01/19/13 1139 Last data filed at 01/19/13 1100  Gross per 24 hour  Intake 1231.08 ml  Output   1155 ml  Net  76.08 ml    Last BM: 1/10   Labs:   Recent Labs Lab 01/16/13 0338 01/18/13 0237 01/19/13 0430  NA 135* 142 146  K 4.5 4.6 4.4  CL 96 106 106  CO2 28 26 31   BUN 20 25* 25*  CREATININE 0.55 0.47* 0.55  CALCIUM 8.3* 7.8* 7.8*  GLUCOSE 112* 114* 128*    CBG (last 3)   Recent Labs  01/18/13 2323 01/19/13 0320 01/19/13 0838  GLUCAP 134* 127* 138*    Scheduled Meds: . antiseptic oral rinse  15 mL Mouth Rinse QID  . arformoterol  15 mcg Nebulization Q12H  . aspirin EC  81 mg Oral Daily  . atorvastatin  20 mg Per Tube q1800  . budesonide (PULMICORT) nebulizer solution  0.5 mg Nebulization Q12H  . cefTAZidime (FORTAZ)  IV  1 g Intravenous Q12H  . chlorhexidine  15 mL Mouth Rinse BID  . diltiazem  30 mg Oral Q6H  . enoxaparin (LOVENOX) injection  40 mg  Subcutaneous Q24H  . levofloxacin (LEVAQUIN) IV  750 mg Intravenous Q24H  . methylPREDNISolone (SOLU-MEDROL) injection  60 mg Intravenous Q8H  . pantoprazole (PROTONIX) IV  40 mg Intravenous Daily    Continuous Infusions: . sodium chloride 20 mL/hr (01/18/13 1802)  . fentaNYL infusion INTRAVENOUS 100 mcg/hr (01/19/13 1100)    Joaquin Courts, RD, LDN, CNSC Pager [phone] After Hours Pager [phone]

## 2013-01-19 NOTE — Progress Notes (Signed)
PULMONARY / CRITICAL CARE MEDICINE   Name: Kaitlyn Snyder MRN: [id number] DOB: [birth year]    ADMISSION DATE:  01/15/2013 CONSULTATION DATE:  01/18/13  REFERRING MD :  [facility] PRIMARY SERVICE: PCCM  CHIEF COMPLAINT:  Acute on Chronic Respiratory Failure  BRIEF PATIENT DESCRIPTION: 78 year old female with chronic respiratory failure secondary to COPD. Failed outpatient treatment for presumed tracheobronchitis and COPD exacerbation. Admitted to step down 1/11 for respiratory failure placed on BiPAP.Respiratory status worsened on 1/14. PCCM consulted.   SIGNIFICANT EVENTS / STUDIES:  1/11 Admitted to [facility] placed on BiPAP  1/14 Intubated, Transferred to ICU  LINES / TUBES: OETT 1/14 >>>  OGT  1/14 >>> L IJ CVL 1/14 >>> Foley 1/14 >>>  CULTURES: 1/11  Sputum >>> neg 1/14  Urine >>>  1/14  Respiratory >>>  1/14  Viral screen >>>  ANTIBIOTICS: Levaquin 1/14 >>>  Ceftazidime 1/14 >>>  SUBJECTIVE: Agitated overnight which improved with one dose of versed.  VITAL SIGNS: Temp:  [97.6 F (36.4 C)-99.1 F (37.3 C)] 99.1 F (37.3 C) (01/15 0320) Pulse Rate:  [89-118] 99 (01/15 0600) Resp:  [8-20] 20 (01/15 0600) BP: (96-138)/(39-89) 122/49 mmHg (01/15 0600) SpO2:  [93 %-100 %] 97 % (01/15 0600) FiO2 (%):  [30 %-100 %] 40 % (01/15 0600) Weight:  [122 lb 5.7 oz (55.5 kg)] 122 lb 5.7 oz (55.5 kg) (01/14 1306)  HEMODYNAMICS: CVP:  [6 mmHg-7 mmHg] 6 mmHg  VENTILATOR SETTINGS: Vent Mode:  [-] PRVC FiO2 (%):  [30 %-100 %] 40 % Set Rate:  [12 bmp-20 bmp] 20 bmp Vt Set:  [500 mL] 500 mL PEEP:  [5 cmH20] 5 cmH20 Plateau Pressure:  [16 cmH20-21 cmH20] 19 cmH20  INTAKE / OUTPUT: Intake/Output     01/14 0701 - 01/15 0700 01/15 0701 - 01/16 0700   I.V. (mL/kg) 1024.4 (18.5)    IV Piggyback 200    Total Intake(mL/kg) 1224.4 (22.1)    Urine (mL/kg/hr) 805 (0.6)    Total Output 805     Net +419.4            PHYSICAL EXAMINATION: General: ETT in place, sedated Neuro: sedated  RASS -2 HEENT: [place], thrush Cardiovascular: regular rate regular rhythm. No murmur, rub, gallop  Lungs: mild expiratory wheeze Abdomen: Soft, non-tender, non-distended.  Musculoskeletal: no pedal edema  Skin: warm, dry  LABS:  CBC  Recent Labs Lab 01/16/13 0338 01/18/13 0237 01/19/13 0430  WBC 6.5 7.3 7.1  HGB 10.9* 11.7* 11.8*  HCT 31.8* 35.2* 36.2  PLT 205 188 176   Coag's No results found for this basename: APTT, INR,  in the last 168 hours BMET  Recent Labs Lab 01/16/13 0338 01/18/13 0237 01/19/13 0430  NA 135* 142 146  K 4.5 4.6 4.4  CL 96 106 106  CO2 28 26 31   BUN 20 25* 25*  CREATININE 0.55 0.47* 0.55  GLUCOSE 112* 114* 128*   Electrolytes  Recent Labs Lab 01/16/13 0338 01/18/13 0237 01/19/13 0430  CALCIUM 8.3* 7.8* 7.8*   Sepsis Markers No results found for this basename: LATICACIDVEN, PROCALCITON, O2SATVEN,  in the last 168 hours ABG  Recent Labs Lab 01/17/13 0205 01/18/13 1533 01/19/13 0420  PHART 7.367 7.288* 7.422  PCO2ART 48.9* 68.9* 48.8*  PO2ART 178.0* 439.0* 84.7   Liver Enzymes  Recent Labs Lab 01/19/13 0430  AST 43*  ALT 60*  ALKPHOS 54  BILITOT 0.6  ALBUMIN 2.7*   Cardiac Enzymes  Recent Labs Lab 01/15/13 0506  PROBNP 1564.0*  Glucose  Recent Labs Lab 01/18/13 1311 01/18/13 1522 01/18/13 1919 01/18/13 2323 01/19/13 0320  GLUCAP 103* 100* 127* 134* 127*   CXR: 1/14 >>> Hardware in good position, no effusion, no infiltrate  ASSESSMENT / PLAN:  PULMONARY  A:  Acute on chronic respiratory failure   AECOPD. P:  Goal pH>7.30, SpO2>92  Continuous mechanical support  VAP bundle  Daily SBT  Trend ABG/CXR  Albuterol scheduled and PRN  Atrovent Trend CXR / ABG Solumedrol 60mg  TID  CARDIOVASCULAR  A:  Hemodynamically stable, no arrhythmia / ischemia. P:  Goal MAP >60 Restart ASA. Lipitor, Cardizem, Imdur  RENAL  A:  No active issues. P:  Daily BMP  D/c CVP NS @ 77  GASTROINTESTINAL  A:   Oral candidiasis. Nutrition. GERD on PPI. P:  NPO Diflucan TF in not extubated today Protonix  HEMATOLOGIC  A:  Stable anemia. VTE Px. P:  Trend CBC  Lovenix  INFECTIOUS  A:  CAP. P:  Abx / cx as above  ENDOCRINE  A:  Normoglycemia. P:  CBG  NEUROLOGIC  A:  Acute encephalopathy. P:  Goal RASS 0 to -1 Fentanyl gtt Versed PRN  Carlynn PurlErik Hoffman DO, PGY-1 7:30 AM   I have personally obtained history, examined patient, evaluated and interpreted laboratory and imaging results, reviewed medical records, formulated assessment / plan and placed orders.  CRITICAL CARE:  The patient is critically ill with multiple organ systems failure and requires high complexity decision making for assessment and support, frequent evaluation and titration of therapies, application of advanced monitoring technologies and extensive interpretation of multiple databases. Critical Care Time devoted to patient care services described in this note is 35 minutes.   Lonia FarberZUBELEVITSKIY, Lafreda Casebeer, MD Pulmonary and Critical Care Medicine [facility] Pager: [phone]  01/19/2013, 9:23 AM

## 2013-01-20 ENCOUNTER — Inpatient Hospital Stay (HOSPITAL_COMMUNITY): Payer: Medicare Other

## 2013-01-20 DIAGNOSIS — Z515 Encounter for palliative care: Secondary | ICD-10-CM

## 2013-01-20 DIAGNOSIS — J189 Pneumonia, unspecified organism: Secondary | ICD-10-CM

## 2013-01-20 LAB — GLUCOSE, CAPILLARY
GLUCOSE-CAPILLARY: 154 mg/dL — AB (ref 70–99)
GLUCOSE-CAPILLARY: 144 mg/dL — AB (ref 70–99)
Glucose-Capillary: 137 mg/dL — ABNORMAL HIGH (ref 70–99)
Glucose-Capillary: 150 mg/dL — ABNORMAL HIGH (ref 70–99)
Glucose-Capillary: 157 mg/dL — ABNORMAL HIGH (ref 70–99)
Glucose-Capillary: 157 mg/dL — ABNORMAL HIGH (ref 70–99)

## 2013-01-20 LAB — BASIC METABOLIC PANEL
BUN: 24 mg/dL — AB (ref 6–23)
CHLORIDE: 105 meq/L (ref 96–112)
CO2: 33 meq/L — AB (ref 19–32)
Calcium: 8.1 mg/dL — ABNORMAL LOW (ref 8.4–10.5)
Creatinine, Ser: 0.5 mg/dL (ref 0.50–1.10)
GFR calc Af Amer: >90 mL/min (ref >90)
GFR calc non Af Amer: >90 mL/min (ref >90)
Glucose, Bld: 162 mg/dL — ABNORMAL HIGH (ref 70–99)
Potassium: 4.4 mEq/L (ref 3.7–5.3)
Sodium: 146 mEq/L (ref 137–147)

## 2013-01-20 LAB — CBC
HEMATOCRIT: 36.7 % (ref 36.0–46.0)
Hemoglobin: 12 g/dL (ref 12.0–15.0)
MCH: 33.3 pg (ref 26.0–34.0)
MCHC: 32.7 g/dL (ref 30.0–36.0)
MCV: 101.9 fL — AB (ref 78.0–100.0)
PLATELETS: 163 10*3/uL (ref 150–400)
RBC: 3.6 MIL/uL — ABNORMAL LOW (ref 3.87–5.11)
RDW: 14 % (ref 11.5–15.5)
WBC: 8.6 10*3/uL (ref 4.0–10.5)

## 2013-01-20 MED ORDER — WHITE PETROLATUM GEL
Status: AC
  Administered 2013-01-20: 0.2
  Filled 2013-01-20: qty 5

## 2013-01-20 MED ORDER — SODIUM CHLORIDE 0.9 % IV SOLN
1.0000 mg/h | INTRAVENOUS | Status: DC
  Filled 2013-01-20: qty 10

## 2013-01-20 MED ORDER — MORPHINE SULFATE 2 MG/ML IJ SOLN
2.0000 mg | INTRAMUSCULAR | Status: DC | PRN
  Administered 2013-01-20 – 2013-01-23 (×8): 2 mg via INTRAVENOUS
  Filled 2013-01-20 (×8): qty 1

## 2013-01-20 MED ORDER — VANCOMYCIN HCL 500 MG IV SOLR
500.0000 mg | Freq: Two times a day (BID) | INTRAVENOUS | Status: DC
  Administered 2013-01-20 – 2013-01-21 (×3): 500 mg via INTRAVENOUS
  Filled 2013-01-20 (×5): qty 500

## 2013-01-20 MED ORDER — FLUCONAZOLE 100MG IVPB
100.0000 mg | INTRAVENOUS | Status: DC
  Administered 2013-01-20 – 2013-01-21 (×2): 100 mg via INTRAVENOUS
  Filled 2013-01-20 (×3): qty 50

## 2013-01-20 NOTE — Progress Notes (Signed)
ANTIBIOTIC CONSULT NOTE - INITIAL  Pharmacy Consult for Ceftazidime/Vanco Indication: pneumonia  No Known Allergies  Patient Measurements: Height: 5\' 2"  (157.5 cm) Weight: 123 lb 7.3 oz (56 kg) IBW/kg (Calculated) : 50.1  Vital Signs: Temp: 101.3 F (38.5 C) (01/16 0745) Temp src: Oral (01/16 0745) BP: 132/52 mmHg (01/16 0500) Pulse Rate: 116 (01/16 0500) Intake/Output from previous day: 01/15 0701 - 01/16 0700 In: 2637 [I.V.:705; NG/GT:770; IV Piggyback:300] Out: 1350 [Urine:1350] Intake/Output from this shift: Total I/O In: 50 [I.V.:20; NG/GT:30] Out: 100 [Urine:100]  Labs:  Recent Labs  01/18/13 0237 01/19/13 0430 01/20/13 0430  WBC 7.3 7.1 8.6  HGB 11.7* 11.8* 12.0  PLT 188 176 163  CREATININE 0.47* 0.55 0.50   Estimated Creatinine Clearance: 48.1 ml/min (by C-G formula based on Cr of 0.5). No results found for this basename: VANCOTROUGH, Corlis Leak, VANCORANDOM, [place], GENTPEAK, GENTRANDOM, TOBRATROUGH, TOBRAPEAK, TOBRARND, AMIKACINPEAK, AMIKACINTROU, AMIKACIN,  in the last 72 hours   Microbiology: Recent Results (from the past 720 hour(s))  MRSA PCR SCREENING     Status: None   Collection Time    01/15/13  3:06 PM      Result Value Range Status   MRSA by PCR NEGATIVE  NEGATIVE Final   Comment:            The GeneXpert MRSA Assay (FDA     approved for NASAL specimens     only), is one component of a     comprehensive MRSA colonization     surveillance program. It is not     intended to diagnose MRSA     infection nor to guide or     monitor treatment for     MRSA infections.  CULTURE, EXPECTORATED SPUTUM-ASSESSMENT     Status: None   Collection Time    01/15/13  3:54 PM      Result Value Range Status   Specimen Description SPUTUM   Final   Special Requests NONE   Final   Sputum evaluation     Final   Value: THIS SPECIMEN IS ACCEPTABLE. RESPIRATORY CULTURE REPORT TO FOLLOW.   Report Status 01/15/2013 FINAL   Final  CULTURE, RESPIRATORY  (NON-EXPECTORATED)     Status: None   Collection Time    01/15/13  3:54 PM      Result Value Range Status   Specimen Description SPUTUM   Final   Special Requests NONE   Final   Gram Stain     Final   Value: NO WBC SEEN     NO SQUAMOUS EPITHELIAL CELLS SEEN     FEW GRAM POSITIVE COCCI IN PAIRS     Performed at [facility]   Culture     Final   Value: NORMAL OROPHARYNGEAL FLORA     Performed at [facility]   Report Status 01/18/2013 FINAL   Final  MRSA PCR SCREENING     Status: None   Collection Time    01/18/13 12:59 PM      Result Value Range Status   MRSA by PCR NEGATIVE  NEGATIVE Final   Comment:            The GeneXpert MRSA Assay (FDA     approved for NASAL specimens     only), is one component of a     comprehensive MRSA colonization     surveillance program. It is not     intended to diagnose MRSA     infection nor to guide or  monitor treatment for     MRSA infections.  URINE CULTURE     Status: None   Collection Time    01/18/13  1:08 PM      Result Value Range Status   Specimen Description URINE, CATHETERIZED   Final   Special Requests Normal   Final   Culture  Setup Time     Final   Value: 01/18/2013 14:08     Performed at [facility]     Final   Value: NO GROWTH     Performed at [facility]   Culture     Final   Value: NO GROWTH     Performed at [facility]   Report Status 01/19/2013 FINAL   Final  RESPIRATORY VIRUS PANEL     Status: None   Collection Time    01/18/13  4:07 PM      Result Value Range Status   Source - RVPAN TRACHEAL ASPIRATE   Final   Respiratory Syncytial Virus A NOT DETECTED   Final   Respiratory Syncytial Virus B NOT DETECTED   Final   Influenza A NOT DETECTED   Final   Influenza B NOT DETECTED   Final   Parainfluenza 1 NOT DETECTED   Final   Parainfluenza 2 NOT DETECTED   Final   Parainfluenza 3 NOT DETECTED   Final   Metapneumovirus NOT DETECTED   Final    Rhinovirus NOT DETECTED   Final   Adenovirus NOT DETECTED   Final   Influenza A H1 NOT DETECTED   Final   Influenza A H3 NOT DETECTED   Final   Comment: (NOTE)           Normal Reference Range for each Analyte: NOT DETECTED     Testing performed using the Luminex xTAG Respiratory Viral Panel test     kit.     This test was developed and its performance characteristics determined     by [facility]. It has not been cleared or approved by the [place]     Food and Drug Administration. This test is used for clinical purposes.     It should not be regarded as investigational or for research. This     laboratory is certified under the [place] (CLIA) as qualified to perform high complexity     clinical laboratory testing.     Performed at [place], RESPIRATORY (NON-EXPECTORATED)     Status: None   Collection Time    01/19/13  9:00 AM      Result Value Range Status   Specimen Description TRACHEAL ASPIRATE   Final   Special Requests Normal   Final   Gram Stain PENDING   Incomplete   Culture     Final   Value: Culture reincubated for better growth     Performed at [facility]   Report Status PENDING   Incomplete    Medical History: Past Medical History  Diagnosis Date  . COPD (chronic obstructive pulmonary disease)   . Hyperlipidemia   . Depression   . Squamous carcinoma   . Anxiety   . Anemia, B12 deficiency     Medications:  Prescriptions prior to admission  Medication Sig Dispense Refill  . albuterol (PROVENTIL HFA;VENTOLIN HFA) 108 (90 BASE) MCG/ACT inhaler Inhale 2 puffs into the lungs every 4 (four) hours as needed.        Marland Kitchen  albuterol (PROVENTIL) (2.5 MG/3ML) 0.083% nebulizer solution Take 3 mLs (2.5 mg total) by nebulization every 6 (six) hours as needed for wheezing or shortness of breath.  75 mL  3  . aspirin 81 MG tablet Take 81 mg by mouth daily.        Marland Kitchen atorvastatin (LIPITOR) 20 MG tablet  Take 20 mg by mouth daily.      Marland Kitchen azithromycin (ZITHROMAX) 250 MG tablet Take as directed  6 each  0  . buPROPion (WELLBUTRIN SR) 100 MG 12 hr tablet Take 1 tablet by mouth daily.      . Calcium Carbonate-Vitamin D (CALTRATE 600+D) 600-400 MG-UNIT per tablet Take 1 tablet by mouth 2 (two) times daily.        . cholecalciferol (VITAMIN D) 1000 UNITS tablet Take 4,000 Units by mouth daily.        . Cyanocobalamin (VITAMIN B 12 PO) Take 2,000 mg by mouth.        . diltiazem (DILACOR XR) 120 MG 24 hr capsule Take 120 mg by mouth daily.      Marland Kitchen FLUoxetine (PROZAC) 20 MG tablet Take 20 mg by mouth daily.        . fluticasone-salmeterol (ADVAIR HFA) 115-21 MCG/ACT inhaler Inhale 2 puffs into the lungs 2 (two) times daily.      Marland Kitchen ibandronate (BONIVA) 150 MG tablet Take 150 mg by mouth every 30 (thirty) days. Take in the morning with a full glass of water, on an empty stomach, and do not take anything else by mouth or lie down for the next 30 min.      . isosorbide mononitrate (IMDUR) 30 MG 24 hr tablet Take 1 tablet by mouth daily.      Marland Kitchen omeprazole (PRILOSEC) 20 MG capsule Take 20 mg by mouth 2 (two) times daily.        . predniSONE (DELTASONE) 20 MG tablet Take 20-60 mg by mouth daily. Take 3 tablets($RemoveBefore'60mg'EckxPyAuKPlui$ ) daily for 2 days, then take 2 tablets($RemoveBefore'40mg'gcjwFfLSHYPlI$ ) daily for 2 days, then take 1 tablet($RemoveBefor'20mg'PnlITkeRAlWQ$ ) daily for 2 days      . tiotropium (SPIRIVA) 18 MCG inhalation capsule Place 18 mcg into inhaler and inhale daily.        . nitroGLYCERIN (NITROSTAT) 0.4 MG SL tablet Place 0.4 mg under the tongue every 5 (five) minutes as needed.         Assessment: 78 y.o. F with chronic resp failure due to COPD/tracheobronchitis. Admitted to SDU 1/11 w/ resp failure- on bipap. Transferred to ICU 1/14 with inability to wean from bipap. Pt on Levaquin Day#6, Ceftazidime Day#3 for PNA. Resp panel neg. Febrile this AM (Tmax 101.3), wbc wnl. CXR today shows LUL pulmonary nodule with mild RUL infiltrate.   1/11 sputum>>nl flora 1/14  urine- no growth 1/15 TA- pending 1/16 Bcx - pending  Goal of Therapy:  Eradication of infection  Plan:  1. Start Vancomycin $RemoveBeforeDE'500mg'QEcEtvAYflPjSHI$  IV q12h 2. Continue Levaquin $RemoveBeforeDEI'750mg'xhRukxgliRWdYMUq$  IV q24h 3. Continue Ceftazidime 1gm IV q12h 4. F/u micro data, renal function, pt's clinical condition and vanc trough at steady state  Rohil Lesch M. Posey Pronto, PharmD Clinical Pharmacist- Resident Pager: [phone] Pharmacy: [phone] 01/20/2013 10:39 AM

## 2013-01-20 NOTE — Progress Notes (Signed)
Pt on previously on Fentanyl drip (10 mcg/ml); 195 ml wasted in sink; Witnessed by  MattelCandance Hemphill, RN  Burnard BuntingKatrice Aleta Manternach, RN

## 2013-01-20 NOTE — Progress Notes (Signed)
PULMONARY / CRITICAL CARE MEDICINE  Name: Kaitlyn FurlongLinda Jones Snyder MRN: [id number] DOB: 03/12/1935    ADMISSION DATE:  01/15/2013 CONSULTATION DATE:  01/18/13  [facility]  [facility] PRIMARY SERVICE: PCCM  CHIEF COMPLAINT:  Acute on Chronic Respiratory Failure  BRIEF PATIENT DESCRIPTION: 78 year old female with chronic respiratory failure secondary to COPD. Failed outpatient treatment for presumed tracheobronchitis and COPD exacerbation. Admitted to step down 1/11 for respiratory failure placed on BiPAP.Respiratory status worsened on 1/14. PCCM consulted.   SIGNIFICANT EVENTS / STUDIES:  [facility]  1/14 Intubated, Transferred to ICU  LINES / TUBES: OETT 1/14 >>>  OGT  1/14 >>> L IJ CVL 1/14 >>> Foley 1/14 >>>  CULTURES: 1/11  Sputum >>> neg 1/14  MRSA >>>neg 1/14  Urine >>> neg 1/14  Respiratory >>>  1/14  Viral screen (tracheal ) >>> neg 1/16  Blood >>>  ANTIBIOTICS: Levaquin 1/10 >>>  Ceftazidime 1/14 >>> Vancomycin 1/16 >>>  SUBJECTIVE:  Fever Tmax 101.2 overnight  VITAL SIGNS: Temp:  [99.2 F (37.3 C)-101.2 F (38.4 C)] 99.7 F (37.6 C) (01/16 0413) Pulse Rate:  [97-140] 116 (01/16 0500) Resp:  [8-27] 17 (01/16 0500) BP: (107-166)/(41-74) 132/52 mmHg (01/16 0500) SpO2:  [95 %-98 %] 96 % (01/16 0500) FiO2 (%):  [40 %] 40 % (01/16 0400) Weight:  [123 lb 7.3 oz (56 kg)] 123 lb 7.3 oz (56 kg) (01/16 0445)  HEMODYNAMICS:    VENTILATOR SETTINGS: Vent Mode:  [-] PRVC FiO2 (%):  [40 %] 40 % Set Rate:  [12 bmp] 12 bmp Vt Set:  [500 mL] 500 mL PEEP:  [5 cmH20] 5 cmH20 Pressure Support:  [5 cmH20] 5 cmH20 Plateau Pressure:  [11 cmH20-21 cmH20] 11 cmH20  INTAKE / OUTPUT: Intake/Output     01/15 0701 - 01/16 0700   I.V. (mL/kg) 645 (11.5)   NG/GT 690   IV Piggyback 300   Total Intake(mL/kg) 1635 (29.2)   Urine (mL/kg/hr) 1350 (1)   Total Output 1350   Net +285         PHYSICAL EXAMINATION: General: ETT in place, sedated Neuro:  sedated RASS -1 HEENT: [place], ETT  Cardiovascular: regular rate regular rhythm. No murmur, rub, gallop  Lungs: CTA b/l no wheezing Abdomen: Soft, non-tender, non-distended.  Musculoskeletal: no pedal edema  Skin: warm, dry  LABS:  CBC  Recent Labs Lab 01/18/13 0237 01/19/13 0430 01/20/13 0430  WBC 7.3 7.1 8.6  HGB 11.7* 11.8* 12.0  HCT 35.2* 36.2 36.7  PLT 188 176 163   Coag's No results found for this basename: APTT, INR,  in the last 168 hours BMET  Recent Labs Lab 01/18/13 0237 01/19/13 0430 01/20/13 0430  NA 142 146 146  K 4.6 4.4 4.4  CL 106 106 105  CO2 26 31 33*  BUN 25* 25* 24*  CREATININE 0.47* 0.55 0.50  GLUCOSE 114* 128* 162*   Electrolytes  Recent Labs Lab 01/18/13 0237 01/19/13 0430 01/20/13 0430  CALCIUM 7.8* 7.8* 8.1*   Sepsis Markers No results found for this basename: LATICACIDVEN, PROCALCITON, O2SATVEN,  in the last 168 hours ABG  Recent Labs Lab 01/17/13 0205 01/18/13 1533 01/19/13 0420  PHART 7.367 7.288* 7.422  PCO2ART 48.9* 68.9* 48.8*  PO2ART 178.0* 439.0* 84.7   Liver Enzymes  Recent Labs Lab 01/19/13 0430  AST 43*  ALT 60*  ALKPHOS 54  BILITOT 0.6  ALBUMIN 2.7*   Cardiac Enzymes  Recent Labs Lab 01/15/13 0506  PROBNP 1564.0*  Glucose  Recent Labs Lab 01/19/13 0838 01/19/13 1158 01/19/13 1557 01/19/13 1929 01/19/13 2348 01/20/13 0411  GLUCAP 138* 127* 123* 121* 150* 157*   CXR: 1/16 >>> Hardware in good position, new RUL airspace disease  ASSESSMENT / PLAN:  PULMONARY  A:  Acute on chronic respiratory failure   AECOPD. Possible RUL pneumonia. P:  Goal pH>7.30, SpO2>92  Continuous mechanical support  VAP bundle  Daily SBT  Trend ABG/CXR  Brovana/Pulmocort neb Q12 Solumedrol 60 mg TID  CARDIOVASCULAR  A:  Hemodynamically stable, no arrhythmia / ischemia. P:  Goal MAP >60 ASA. Lipitor, Cardizem, Imdur  RENAL  A:  No active issues. P:  Daily BMP  NS @ 75  GASTROINTESTINAL  A:   Oral candidiasis. Nutrition. GERD on PPI. Moderate Malnutrition P:  NPO Diflucan 100 mg x 7 days TF per nutrition Protonix  HEMATOLOGIC  A:  Stable anemia. VTE Px. P:  Trend CBC  Lovenix  INFECTIOUS  A:  CAP. Possible HCAP Influenza neg P:  Abx / cx as above Add Vancomycin D/c droplet isolation  ENDOCRINE  A:  Normoglycemia. P:  CBG  NEUROLOGIC  A:  Acute encephalopathy. P:  Goal RASS 0 to -1 Fentanyl gtt Versed PRN  [name] DO, PGY-1 6:07 AM   I have personally obtained history, examined patient, evaluated and interpreted laboratory and imaging results, reviewed medical records, formulated assessment / plan and placed orders.  CRITICAL CARE:  The patient is critically ill with multiple organ systems failure and requires high complexity decision making for assessment and support, frequent evaluation and titration of therapies, application of advanced monitoring technologies and extensive interpretation of multiple databases. Critical Care Time devoted to patient care services described in this note is 35 minutes.   Lonia Farber, MD Pulmonary and Critical Care Medicine [facility] Pager: [phone]  01/20/2013, 10:31 AM

## 2013-01-21 ENCOUNTER — Inpatient Hospital Stay (HOSPITAL_COMMUNITY): Payer: Medicare Other

## 2013-01-21 DIAGNOSIS — Z515 Encounter for palliative care: Secondary | ICD-10-CM

## 2013-01-21 DIAGNOSIS — J9610 Chronic respiratory failure, unspecified whether with hypoxia or hypercapnia: Secondary | ICD-10-CM

## 2013-01-21 DIAGNOSIS — R0902 Hypoxemia: Secondary | ICD-10-CM

## 2013-01-21 LAB — BASIC METABOLIC PANEL
BUN: 19 mg/dL (ref 6–23)
CO2: 36 mEq/L — ABNORMAL HIGH (ref 19–32)
CREATININE: 0.46 mg/dL — AB (ref 0.50–1.10)
Calcium: 8.4 mg/dL (ref 8.4–10.5)
Chloride: 102 mEq/L (ref 96–112)
Glucose, Bld: 145 mg/dL — ABNORMAL HIGH (ref 70–99)
Potassium: 4.2 mEq/L (ref 3.7–5.3)
Sodium: 145 mEq/L (ref 137–147)

## 2013-01-21 LAB — CBC
HEMATOCRIT: 39.2 % (ref 36.0–46.0)
Hemoglobin: 12.7 g/dL (ref 12.0–15.0)
MCH: 33 pg (ref 26.0–34.0)
MCHC: 32.4 g/dL (ref 30.0–36.0)
MCV: 101.8 fL — ABNORMAL HIGH (ref 78.0–100.0)
Platelets: 162 10*3/uL (ref 150–400)
RBC: 3.85 MIL/uL — ABNORMAL LOW (ref 3.87–5.11)
RDW: 13.9 % (ref 11.5–15.5)
WBC: 13.1 10*3/uL — ABNORMAL HIGH (ref 4.0–10.5)

## 2013-01-21 LAB — MAGNESIUM: Magnesium: 2 mg/dL (ref 1.5–2.5)

## 2013-01-21 LAB — GLUCOSE, CAPILLARY
Glucose-Capillary: 136 mg/dL — ABNORMAL HIGH (ref 70–99)
Glucose-Capillary: 126 mg/dL — ABNORMAL HIGH (ref 70–99)

## 2013-01-21 LAB — CULTURE, RESPIRATORY: SPECIAL REQUESTS: NORMAL

## 2013-01-21 LAB — CULTURE, RESPIRATORY W GRAM STAIN

## 2013-01-21 MED ORDER — ATORVASTATIN CALCIUM 80 MG PO TABS
80.0000 mg | ORAL_TABLET | Freq: Every day | ORAL | Status: DC
  Administered 2013-01-21 – 2013-01-23 (×3): 80 mg via ORAL
  Filled 2013-01-21 (×4): qty 1

## 2013-01-21 MED ORDER — METOPROLOL TARTRATE 25 MG PO TABS
25.0000 mg | ORAL_TABLET | Freq: Two times a day (BID) | ORAL | Status: DC
  Administered 2013-01-21 – 2013-01-24 (×7): 25 mg via ORAL
  Filled 2013-01-21 (×9): qty 1

## 2013-01-21 MED ORDER — METOPROLOL TARTRATE 1 MG/ML IV SOLN
5.0000 mg | Freq: Four times a day (QID) | INTRAVENOUS | Status: DC
  Administered 2013-01-21 (×2): 5 mg via INTRAVENOUS
  Filled 2013-01-21 (×6): qty 5

## 2013-01-21 NOTE — Progress Notes (Signed)
PULMONARY / CRITICAL CARE MEDICINE  Name: Kaitlyn Snyder MRN: [id number] DOB: May 21, 1935    ADMISSION DATE:  01/15/2013 CONSULTATION DATE:  01/18/13  REFERRING MD :  [facility] PRIMARY SERVICE: PCCM  CHIEF COMPLAINT:  Acute on Chronic Respiratory Failure  BRIEF PATIENT DESCRIPTION: 78 year old female with chronic respiratory failure secondary to COPD. Failed outpatient treatment for presumed tracheobronchitis and COPD exacerbation. Admitted to step down 1/11 for respiratory failure placed on BiPAP.Respiratory status worsened on 1/14. PCCM consulted.   SIGNIFICANT EVENTS / STUDIES:  1/11 Admitted to [facility] placed on BiPAP  1/14 Intubated, Transferred to ICU  LINES / TUBES: OETT 1/14 >>> 1/16 OGT  1/14 >>>1/16 L IJ CVL 1/14 >>> Foley 1/14 >>>  CULTURES: 1/11  Sputum >>> neg 1/14  MRSA >>>neg 1/14  Urine >>> neg 1/14  Respiratory >>>  1/14  Viral screen (tracheal ) >>> neg 1/16  Blood >>>neg  ANTIBIOTICS: Levaquin 1/10 >>>  Ceftazidime 1/14 >>> Vancomycin 1/16 >>>1/17  SUBJECTIVE:  Fever down  extub and no real change  VITAL SIGNS: Temp:  [97.6 F (36.4 C)-101.2 F (38.4 C)] 97.6 F (36.4 C) (01/17 0808) Pulse Rate:  [88-137] 88 (01/17 0800) Resp:  [7-27] 7 (01/17 0800) BP: (117-158)/(52-76) 133/60 mmHg (01/17 0800) SpO2:  [94 %-99 %] 98 % (01/17 0800) FiO2 (%):  [40 %-100 %] 100 % (01/16 1618) Weight:  [54.7 kg (120 lb 9.5 oz)] 54.7 kg (120 lb 9.5 oz) (01/17 0500)  HEMODYNAMICS:    VENTILATOR SETTINGS: Vent Mode:  [-] CPAP;PSV FiO2 (%):  [40 %-100 %] 100 % Set Rate:  [12 bmp] 12 bmp Vt Set:  [500 mL] 500 mL PEEP:  [5 cmH20] 5 cmH20 Pressure Support:  [8 cmH20] 8 cmH20 Plateau Pressure:  [10 cmH20] 10 cmH20  INTAKE / OUTPUT: Intake/Output     01/16 0701 - 01/17 0700 01/17 0701 - 01/18 0700   I.V. (mL/kg) 345 (6.3)    NG/GT 280    IV Piggyback 450    Total Intake(mL/kg) 1075 (19.7)    Urine (mL/kg/hr) 1130 (0.9)    Total Output 1130     Net -55            PHYSICAL EXAMINATION: General: extubated Neuro:alert, not on mso4 drip HEENT: [place],   Cardiovascular: regular rate regular rhythm. No murmur, rub, gallop  Lungs: CTA b/l no wheezing Abdomen: Soft, non-tender, non-distended.  Musculoskeletal: no pedal edema  Skin: warm, dry  LABS:  CBC  Recent Labs Lab 01/19/13 0430 01/20/13 0430 01/21/13 0227  WBC 7.1 8.6 13.1*  HGB 11.8* 12.0 12.7  HCT 36.2 36.7 39.2  PLT 176 163 162   Coag's No results found for this basename: APTT, INR,  in the last 168 hours BMET  Recent Labs Lab 01/19/13 0430 01/20/13 0430 01/21/13 0227  NA 146 146 145  K 4.4 4.4 4.2  CL 106 105 102  CO2 31 33* 36*  BUN 25* 24* 19  CREATININE 0.55 0.50 0.46*  GLUCOSE 128* 162* 145*   Electrolytes  Recent Labs Lab 01/19/13 0430 01/20/13 0430 01/21/13 0227  CALCIUM 7.8* 8.1* 8.4  MG  --   --  2.0   Sepsis Markers No results found for this basename: LATICACIDVEN, PROCALCITON, O2SATVEN,  in the last 168 hours ABG  Recent Labs Lab 01/17/13 0205 01/18/13 1533 01/19/13 0420  PHART 7.367 7.288* 7.422  PCO2ART 48.9* 68.9* 48.8*  PO2ART 178.0* 439.0* 84.7   Liver Enzymes  Recent Labs Lab 01/19/13 0430  AST 43*  ALT 60*  ALKPHOS 54  BILITOT 0.6  ALBUMIN 2.7*   Cardiac Enzymes  Recent Labs Lab 01/15/13 0506  PROBNP 1564.0*   Glucose  Recent Labs Lab 01/19/13 2348 01/20/13 0411 01/20/13 0738 01/20/13 1144 01/20/13 2019 01/20/13 2253  GLUCAP 150* 157* 154* 157* 144* 137*   CXR:no film  ASSESSMENT / PLAN:  PULMONARY  A:  Acute on chronic respiratory failure   AECOPD.  RUL pneumonia.HCAP P:  Titrate oxygen No reintubation Brovana/Pulmocort neb Q12 Solumedrol 60 mg TID  CARDIOVASCULAR  A:  Hemodynamically stable, no arrhythmia / ischemia. P:   ASA. Lipitor, Cardizem, Imdur  RENAL  A:  No active issues. P:   Minimize labs  GASTROINTESTINAL  A:  Oral candidiasis. Nutrition. GERD on PPI. Moderate  Malnutrition P:  Adv diet  HEMATOLOGIC  A:  Stable anemia. VTE Px. P:  Trend CBC  Lovenix  INFECTIOUS  A:  CAP. Possible HCAP Influenza neg P:  Abx / cx as above Narrow ABX   ENDOCRINE  A:  Normoglycemia. P:  CBG  NEUROLOGIC  A:  Acute encephalopathy. P:  Goal RASS 0 Off drips, did not need morphine drip  8:30 AM   I have personally obtained history, examined patient, evaluated and interpreted laboratory and imaging results, reviewed medical records, formulated assessment / plan and placed orders.  CRITICAL CARE:  The patient is critically ill with multiple organ systems failure and requires high complexity decision making for assessment and support, frequent evaluation and titration of therapies, application of advanced monitoring technologies and extensive interpretation of multiple databases. Critical Care Time devoted to patient care services described in this note is 35 minutes.   Shan LevansPatrick Wright, MD Beeper  [phone]  Cell  [phone]  If no response or cell goes to voicemail, call beeper [phone]  Pulmonary and Critical Care Medicine [facility] Pager: [phone]  01/21/2013, 8:30 AM

## 2013-01-21 NOTE — Progress Notes (Signed)
CRITICAL VALUE ALERT  Critical value received:  Blood culture, aerobic bottle - gram positive cocci in clusters  Date of notification:  01-21-2013  Time of notification:  12:58  Critical value read back:yes  Nurse who received alert:  Meridee ScoreAndy Stana Bayon, NR  MD notified (1st page):  PCCM  Time of first page:  13:01  Responding MD:  Delford FieldWright  Time MD responded:  13:03

## 2013-01-21 NOTE — Progress Notes (Signed)
Called PCCM, to clarify morphine drip order.  Dr. Sherene SiresWert returned my page.  Dr. Sherene SiresWert said to leave order in.

## 2013-01-22 MED ORDER — PANTOPRAZOLE SODIUM 40 MG PO TBEC
40.0000 mg | DELAYED_RELEASE_TABLET | Freq: Every day | ORAL | Status: DC
  Administered 2013-01-22 – 2013-01-24 (×3): 40 mg via ORAL
  Filled 2013-01-22 (×3): qty 1

## 2013-01-22 MED ORDER — SODIUM CHLORIDE 0.9 % IJ SOLN
10.0000 mL | INTRAMUSCULAR | Status: DC | PRN
  Administered 2013-01-22 (×2): 20 mL
  Administered 2013-01-23 (×4): 10 mL

## 2013-01-22 MED ORDER — LEVOFLOXACIN 750 MG PO TABS
750.0000 mg | ORAL_TABLET | Freq: Every day | ORAL | Status: DC
  Administered 2013-01-22 – 2013-01-23 (×2): 750 mg via ORAL
  Filled 2013-01-22 (×2): qty 1

## 2013-01-22 MED ORDER — PREDNISONE 20 MG PO TABS
40.0000 mg | ORAL_TABLET | Freq: Every day | ORAL | Status: DC
  Administered 2013-01-23 – 2013-01-24 (×2): 40 mg via ORAL
  Filled 2013-01-22 (×5): qty 2

## 2013-01-22 NOTE — Progress Notes (Signed)
PULMONARY / CRITICAL CARE MEDICINE  Name: Kaitlyn FurlongLinda Jones Snyder MRN: [id number] DOB: 08/26/1935    ADMISSION DATE:  01/15/2013 CONSULTATION DATE:  01/18/13  [facility]  CHIEF COMPLAINT:  Acute on Chronic Respiratory Failure  BRIEF PATIENT DESCRIPTION:  78 year old female with chronic respiratory failure secondary to COPD. Failed outpatient treatment for presumed tracheobronchitis and COPD exacerbation. Admitted to step down 1/11 for respiratory failure placed on BiPAP.Respiratory status worsened on 1/14. PCCM consulted> transferred to floor 1/17 as NCB status  SIGNIFICANT EVENTS / STUDIES:  [facility]  1/14 Intubated, Transferred to ICU  LINES / TUBES: OETT 1/14 >>> 1/16 OGT  1/14 >>>1/16 L IJ CVL 1/14 >>> Foley 1/14 >>>  CULTURES: 1/11  Sputum >>> neg 1/14  MRSA >>>neg 1/14  Urine >>> neg 1/14  Respiratory >>>  1/14  Viral screen (tracheal ) >>> neg 1/16  Blood >>>neg  ANTIBIOTICS: Levaquin 1/10 >>>  Ceftazidime 1/14 > 1/18  Vancomycin 1/16 >>>1/17 Diflucan 1/15>  1/18   SUBJECTIVE:   comfortable on floor on 02  @ 4lpm NP   VITAL SIGNS: Temp:  [97.3 F (36.3 C)-98.1 F (36.7 C)] 97.4 F (36.3 C) (01/18 0812) Pulse Rate:  [78-99] 88 (01/18 0812) Resp:  [8-22] 16 (01/18 0812) BP: (126-134)/(51-78) 126/77 mmHg (01/18 0812) SpO2:  [96 %-98 %] 96 % (01/18 0812)      INTAKE / OUTPUT: Intake/Output     01/17 0701 - 01/18 0700 01/18 0701 - 01/19 0700   I.V. (mL/kg) 230 (4.2)    NG/GT     IV Piggyback 450    Total Intake(mL/kg) 680 (12.4)    Urine (mL/kg/hr) 400 (0.3)    Total Output 400     Net +280            PHYSICAL EXAMINATION: General: chronically ill nad at 45 deg hob HEENT mild turbinate edema.  Oropharynx no thrush or excess pnd or cobblestoning.  No JVD or cervical adenopathy. Mild accessory muscle hypertrophy. Trachea midline, nl thryroid. Chest was hyperinflated by percussion with diminished  breath sounds and moderate increased exp time without wheeze. Hoover sign positive at mid inspiration. Regular rate and rhythm without murmur gallop or rub or increase P2 or edema.  Abd: no hsm, nl excursion. Ext warm without cyanosis or clubbing.    LABS:  CBC  Recent Labs Lab 01/19/13 0430 01/20/13 0430 01/21/13 0227  WBC 7.1 8.6 13.1*  HGB 11.8* 12.0 12.7  HCT 36.2 36.7 39.2  PLT 176 163 162   Coag's No results found for this basename: APTT, INR,  in the last 168 hours BMET  Recent Labs Lab 01/19/13 0430 01/20/13 0430 01/21/13 0227  NA 146 146 145  K 4.4 4.4 4.2  CL 106 105 102  CO2 31 33* 36*  BUN 25* 24* 19  CREATININE 0.55 0.50 0.46*  GLUCOSE 128* 162* 145*   Electrolytes  Recent Labs Lab 01/19/13 0430 01/20/13 0430 01/21/13 0227  CALCIUM 7.8* 8.1* 8.4  MG  --   --  2.0   Sepsis Markers No results found for this basename: LATICACIDVEN, PROCALCITON, O2SATVEN,  in the last 168 hours ABG  Recent Labs Lab 01/17/13 0205 01/18/13 1533 01/19/13 0420  PHART 7.367 7.288* 7.422  PCO2ART 48.9* 68.9* 48.8*  PO2ART 178.0* 439.0* 84.7   Liver Enzymes  Recent Labs Lab 01/19/13 0430  AST 43*  ALT 60*  ALKPHOS 54  BILITOT 0.6  ALBUMIN 2.7*  Cardiac Enzymes No results found for this basename: TROPONINI, PROBNP,  in the last 168 hours Glucose  Recent Labs Lab 01/20/13 0738 01/20/13 1144 01/20/13 2019 01/20/13 2253 01/21/13 0708 01/21/13 1108  GLUCAP 154* 157* 144* 137* 136* 126*   CXR:no film  ASSESSMENT / PLAN:  PULMONARY  A:  Acute on chronic respiratory failure   AECOPD. RUL pneumonia.HCAP P:  Titrate oxygen  Brovana/Pulmocort neb Q12 Solumedrol > changed to pred 1/18  CARDIOVASCULAR  A:  Hemodynamically stable, no arrhythmia / ischemia. P:  ASA. Lipitor, Cardizem, Imdur   RENAL  A:  No active issues. P:   Minimize labs  GASTROINTESTINAL  A:  Oral candidiasis. Nutrition. GERD on PPI. Moderate Malnutrition P:   Adv diet  HEMATOLOGIC  A:  Stable anemia. VTE Px. P:  Trend CBC  Lovenox  INFECTIOUS  A:  CAP. Possible HCAP Influenza neg P:  Abx / cx as above     ENDOCRINE  A:  Normoglycemia. P:  CBG  NEUROLOGIC  A:  Acute encephalopathy/ resolving. P:   Off drips, did not need morphine drip    Kaitlyn Hughs, MD Pulmonary and Critical Care Medicine [facility] Cell [phone] After 5:30 PM or weekends, call [phone]

## 2013-01-23 LAB — CULTURE, BLOOD (ROUTINE X 2)

## 2013-01-23 MED ORDER — SCOPOLAMINE 1 MG/3DAYS TD PT72
1.0000 | MEDICATED_PATCH | TRANSDERMAL | Status: DC
  Administered 2013-01-23: 1.5 mg via TRANSDERMAL
  Filled 2013-01-23: qty 1

## 2013-01-23 MED ORDER — BUDESONIDE 0.5 MG/2ML IN SUSP: 0.5000 mg | Freq: Two times a day (BID) | RESPIRATORY_TRACT | Status: AC

## 2013-01-23 MED ORDER — MORPHINE SULFATE (CONCENTRATE) 10 MG /0.5 ML PO SOLN
10.0000 mg | ORAL | Status: DC | PRN
  Administered 2013-01-23 – 2013-01-24 (×3): 10 mg via SUBLINGUAL
  Filled 2013-01-23 (×4): qty 0.5

## 2013-01-23 MED ORDER — ONDANSETRON 8 MG PO TBDP: 8.0000 mg | ORAL_TABLET | Freq: Three times a day (TID) | ORAL | Status: DC | PRN

## 2013-01-23 MED ORDER — PREDNISONE 20 MG PO TABS
ORAL_TABLET | ORAL | Status: DC
Start: 2013-01-23 — End: 2013-11-29

## 2013-01-23 MED ORDER — MORPHINE SULFATE (CONCENTRATE) 10 MG /0.5 ML PO SOLN: 10.0000 mg | ORAL | Status: DC | PRN

## 2013-01-23 MED ORDER — HYDROCOD POLST-CHLORPHEN POLST 10-8 MG/5ML PO LQCR
5.0000 mL | Freq: Two times a day (BID) | ORAL | Status: DC | PRN
  Administered 2013-01-24: 5 mL via ORAL
  Filled 2013-01-23: qty 5

## 2013-01-23 MED ORDER — HYDROCOD POLST-CHLORPHEN POLST 10-8 MG/5ML PO LQCR: 5.0000 mL | Freq: Two times a day (BID) | ORAL | Status: AC | PRN

## 2013-01-23 MED ORDER — ACETAMINOPHEN 650 MG RE SUPP: 650.0000 mg | Freq: Four times a day (QID) | RECTAL | Status: DC | PRN

## 2013-01-23 MED ORDER — ACETAMINOPHEN 325 MG PO TABS: 650.0000 mg | ORAL_TABLET | Freq: Four times a day (QID) | ORAL | Status: DC | PRN

## 2013-01-23 MED ORDER — LEVOFLOXACIN 750 MG PO TABS: 750.0000 mg | ORAL_TABLET | ORAL | Status: DC

## 2013-01-23 MED ORDER — ARFORMOTEROL TARTRATE 15 MCG/2ML IN NEBU: 15.0000 ug | INHALATION_SOLUTION | Freq: Two times a day (BID) | RESPIRATORY_TRACT | Status: AC

## 2013-01-23 NOTE — Progress Notes (Signed)
Patient experienced anxiety related to breathing at 0438. Gave patient 2 mg of morphine twice over a 30 minute period and increased oxygen from 2 litre to 4. Patient appears to be responding positively. Georga HackingStephen Donovan Persley RN

## 2013-01-23 NOTE — Discharge Summary (Deleted)
Physician Discharge Summary  Patient ID: Kaitlyn Snyder MRN: [id number] DOB/AGE: Dec 03, 1935 78 y.o.  Admit date: 01/15/2013 Discharge date: 01/23/2013    Discharge Diagnoses:  Active Problems:   Pulmonary nodules   Acute-on-chronic respiratory failure   COPD with acute exacerbation   Dehydration   Low urine output   Tobacco abuse   CAP (community acquired pneumonia)   Palliative care patient    Brief Summary: Kaitlyn Snyder is a 78 y.o. y/o female with a PMH of COPD, Hyperlipidemia, squamous carcinoma, and anemia who was admitted 01/15/2013 with acute on chronic respiratory failure in the setting of AECOPD. She was initially admitted 1/11 to step down and placed on BiPAP. 1/14 she began to decompensate despite BiPAP. After being discussed with family the decision was made treat her aggressively for acute respiratory failure which included intubation, mechanical ventilation, and a transfer to the ICU. She was extubated on 1/16 with no reintubation and moved to the floor for comfort measure. It was decided that at this point it was decided to change code status to DNR. 1/18 She will be discharged to home with hospice and comfort directed care.    Consults:   Lines/tubes: OETT 1/14 > 1/16  OGT 1/14 >1/16  L IJ CVL 1/14 >1/20 Foley 1/14 >1/20   Microbiology/Sepsis markers: 1/11 Sputum >>> neg  1/14 MRSA >>>neg  1/14 Urine >>> neg  1/14 Respiratory >>> neg 1/14 Viral screen (tracheal ) >>> neg  1/16 Blood >>>neg   Significant Diagnostic Studies:                                                                    D/c plan by Discharge Diagnosis  Acute on chronic resp failure  COPD  HCAP  D/c plan --  PRN roxanol  --adjustments/change in rx per hospice (Dr. Halford Chessman will be hospice MD)  Symptom management  tussionex for cough as long as awake enough to take  Cont O2 - d/w family to d/c if bothersome to pt  Cont BD  scopolamine patch per hospice if indicated as she  declines  S/p course abx    No other meds/interventions unless needed for comfort.    Filed Vitals:   01/22/13 2136 01/22/13 2239 01/23/13 0627 01/23/13 0950  BP:  115/55 116/57 113/58  Pulse:  80 69 73  Temp:  97.6 F (36.4 C) 97.5 F (36.4 C) 97.8 F (36.6 C)  TempSrc:  Oral Oral Oral  Resp:  _0 Height:      Weight:  56.609 kg (124 lb 12.8 oz)    SpO2: 98% 97% 95% 99%     Discharge Labs  BMET  Recent Labs Lab 01/18/13 0237 01/19/13 0430 01/20/13 0430 01/21/13 0227  NA 142 146 146 145  K 4.6 4.4 4.4 4.2  CL 106 106 105 102  CO2 26 31 33* 36*  GLUCOSE 114* 128* 162* 145*  BUN 25* 25* 24* 19  CREATININE 0.47* 0.55 0.50 0.46*  CALCIUM 7.8* 7.8* 8.1* 8.4  MG  --   --   --  2.0     CBC   Recent Labs Lab 01/19/13 0430 01/20/13 0430 01/21/13 0227  HGB 11.8* 12.0 12.7  HCT 36.2 36.7 39.2  WBC  7.1 8.6 13.1*  PLT 176 163 162   Anti-Coagulation No results found for this basename: INR,  in the last 168 hours      Medication List    STOP taking these medications       aspirin 81 MG tablet     atorvastatin 20 MG tablet  Commonly known as:  LIPITOR     azithromycin 250 MG tablet  Commonly known as:  ZITHROMAX     buPROPion 100 MG 12 hr tablet  Commonly known as:  WELLBUTRIN SR     CALTRATE 600+D 600-400 MG-UNIT per tablet  Generic drug:  Calcium Carbonate-Vitamin D     cholecalciferol 1000 UNITS tablet  Commonly known as:  VITAMIN D     diltiazem 120 MG 24 hr capsule  Commonly known as:  DILACOR XR     FLUoxetine 20 MG tablet  Commonly known as:  PROZAC     fluticasone-salmeterol 115-21 MCG/ACT inhaler  Commonly known as:  ADVAIR HFA     ibandronate 150 MG tablet  Commonly known as:  BONIVA     isosorbide mononitrate 30 MG 24 hr tablet  Commonly known as:  IMDUR     nitroGLYCERIN 0.4 MG SL tablet  Commonly known as:  NITROSTAT     omeprazole 20 MG capsule  Commonly known as:  PRILOSEC     tiotropium 18 MCG inhalation  capsule  Commonly known as:  SPIRIVA     VITAMIN B 12 PO      TAKE these medications       acetaminophen 325 MG tablet  Commonly known as:  TYLENOL  Take 2 tablets (650 mg total) by mouth every 6 (six) hours as needed for mild pain (or Fever >/= 101).     acetaminophen 650 MG suppository  Commonly known as:  TYLENOL  Place 1 suppository (650 mg total) rectally every 6 (six) hours as needed for mild pain (or Fever >/= 101).     albuterol 108 (90 BASE) MCG/ACT inhaler  Commonly known as:  PROVENTIL HFA;VENTOLIN HFA  Inhale 2 puffs into the lungs every 4 (four) hours as needed.     albuterol (2.5 MG/3ML) 0.083% nebulizer solution  Commonly known as:  PROVENTIL  Take 3 mLs (2.5 mg total) by nebulization every 6 (six) hours as needed for wheezing or shortness of breath.     arformoterol 15 MCG/2ML Nebu  Commonly known as:  BROVANA  Take 2 mLs (15 mcg total) by nebulization every 12 (twelve) hours.     budesonide 0.5 MG/2ML nebulizer solution  Commonly known as:  PULMICORT  Take 2 mLs (0.5 mg total) by nebulization every 12 (twelve) hours.     chlorpheniramine-HYDROcodone 10-8 MG/5ML Lqcr  Commonly known as:  TUSSIONEX  Take 5 mLs by mouth every 12 (twelve) hours as needed for cough.     morphine CONCENTRATE 10 mg / 0.5 ml concentrated solution  Place 0.5 mLs (10 mg total) under the tongue every 3 (three) hours as needed for moderate pain, severe pain or shortness of breath.     ondansetron 8 MG disintegrating tablet  Commonly known as:  ZOFRAN ODT  Take 1 tablet (8 mg total) by mouth every 8 (eight) hours as needed for nausea or vomiting.     predniSONE 20 MG tablet  Commonly known as:  DELTASONE  4 tabs PO daily x 3 days then 3 tabs PO daily x 3 days then 2 tabs PO daily x 3 days then 1  tab PO daily x 3 days then STOP          Disposition: 06-Home-Health Care Svc  Discharged Condition: Kaitlyn Snyder has met maximum benefit of inpatient care and is medically  stable and cleared for discharge.  Patient is pending follow up as above.      Time spent on disposition:  Greater than 35 minutes.   Signed: Georgann Housekeeper, ACNP [facility] Pager [phone] or [phone]    *Care during the described time interval was provided by me and/or other providers on the critical care team. I have reviewed this patient's available data, including medical history, events of note, physical examination and test results as part of my evaluation.

## 2013-01-23 NOTE — Progress Notes (Addendum)
Notified by Johny Shockheryl Royal CMRN, pt/family request services of [facility] at discharge; spoke briefly with patient, daughter Gavin PoundDeborah, s-i-l and granddaughter at bedside; initiated education related to hospice philosophy, services at home and team approach to care; all voiced good understanding of information provided.  Per discussion plan for discharge by non-emergent transport once arrangements in place - per Dr Evlyn CourierSood's note today looking at possible d/c 01/25/13 *Note Please send completed GOLD DNR form home with pt   DME needs discussed- [facility]; family requests [facility] D: fully electric hospital bed, with AP&P mattress pad, full rails, over-bed table and add a 3n1 BSC  *Please contact daughter Gavin PoundDeborah @ [phone] to arrange equipment delivery [facility] C[facility] aware of above and will follow [facility]  [facility] contact information given to daughter at bedside Will continue to follow to assist with any hospice needs; Please call with any questions  Valente DavidMargie Loris Seelye, RN 01/23/2013, 5:04 PM [facility] RN Liaison [phone]

## 2013-01-23 NOTE — Care Management Note (Addendum)
   CARE MANAGEMENT NOTE 01/23/2013  Patient:  Kaitlyn, Snyder   Account Number:  [id number]  Date Initiated:  01/16/2013  Documentation initiated by:  Marvetta Gibbons  Subjective/Objective Assessment:   Pt admitted with COPD     Action/Plan:   PTA pt lived at home- NCM to follow for d/c needs  01/23/13 Pt [facility].  [facility] notifed.   Anticipated DC Date:  01/25/2013   Anticipated DC Plan:  [place]  CM consult      Choice offered to / List presented to:             Status of service:  In process, will continue to follow Medicare Important Message given?   (If response is "NO", the following Medicare IM given date fields will be blank) Date Medicare IM given:   Date Additional Medicare IM given:    Discharge Disposition:    Per UR Regulation:  Reviewed for med. necessity/level of care/duration of stay  If discussed at [place] of Stay Meetings, dates discussed:    Comments:  ContactMargo Common Daughter [phone]  01/23/2013 Mellody Memos RN of [facility] met with pt and daughter and informed this CM of home DME needs. Orders written and contact is daughter, Memory Argue @ [phone] for home delivery. Will follow for delivery tomorrow 01/24/13 and possible d/c.  Jasmine Pang RN MPH, case manager, [phone]   01-18-13 2:50pm Shanna Cisco [phone] Patient continued to deteriorate - required intubation and tx to ICU.

## 2013-01-23 NOTE — Progress Notes (Signed)
PULMONARY / CRITICAL CARE MEDICINE  Name: Kaitlyn FurlongLinda Jones Snyder MRN: [id number] DOB: 07/04/1935    ADMISSION DATE:  01/15/2013 CONSULTATION DATE:  01/18/13  [facility]  CHIEF COMPLAINT:  Acute on Chronic Respiratory Failure  BRIEF PATIENT DESCRIPTION:  78 year old female with chronic respiratory failure secondary to COPD. Failed outpatient treatment for presumed tracheobronchitis and COPD exacerbation. Admitted to step down 1/11 for respiratory failure placed on BiPAP.Respiratory status worsened on 1/14. PCCM consulted> transferred to floor 1/17 as NCB status.   SIGNIFICANT EVENTS / STUDIES:  [facility]  1/14 Intubated, Transferred to ICU  LINES / TUBES: OETT 1/14 >>> 1/16 OGT  1/14 >>>1/16 L IJ CVL 1/14 >>> Foley 1/14 >>>  CULTURES: 1/11  Sputum >>> neg 1/14  MRSA >>>neg 1/14  Urine >>> neg 1/14  Respiratory >>> Neg 1/14  Viral screen (tracheal ) >>> neg 1/16  Blood >>>neg  ANTIBIOTICS: Levaquin 1/10 >>>  Ceftazidime 1/14 > 1/18  Vancomycin 1/16 >>>1/17 Diflucan 1/15>  1/18   SUBJECTIVE:   comfortable on floor on 02  @ 4lpm, discharge discussed and planned for 1/21 per Case Management.   VITAL SIGNS: Temp:  [97.5 F (36.4 C)-98.5 F (36.9 C)] 97.8 F (36.6 C) (01/19 0950) Pulse Rate:  [69-84] 73 (01/19 0950) Resp:  [16-17] 17 (01/19 0950) BP: (113-130)/(55-64) 113/58 mmHg (01/19 0950) SpO2:  [93 %-99 %] 99 % (01/19 0950) Weight:  [56.609 kg (124 lb 12.8 oz)] 56.609 kg (124 lb 12.8 oz) (01/18 2239)      INTAKE / OUTPUT: Intake/Output     01/18 0701 - 01/19 0700 01/19 0701 - 01/20 0700   P.O. 600 0   I.V. (mL/kg) 20 (0.4)    IV Piggyback     Total Intake(mL/kg) 620 (11)    Urine (mL/kg/hr) 350 (0.3)    Total Output 350     Net +270 0        Urine Occurrence 1 x      PHYSICAL EXAMINATION: General: 78 year old female of normal body habitus.  Neuro: awake, alert, mild confusion.  HEENT: [place],  AT Cardiovascular: regular rate regular rhythm. No murmur, rub, gallop  Lungs: CTA b/l no wheezing  Abdomen: Soft, non-tender, non-distended.  Musculoskeletal: no pedal edema  Skin: warm, dry, moist MM    LABS:  CBC  Recent Labs Lab 01/19/13 0430 01/20/13 0430 01/21/13 0227  WBC 7.1 8.6 13.1*  HGB 11.8* 12.0 12.7  HCT 36.2 36.7 39.2  PLT 176 163 162   Coag's No results found for this basename: APTT, INR,  in the last 168 hours BMET  Recent Labs Lab 01/19/13 0430 01/20/13 0430 01/21/13 0227  NA 146 146 145  K 4.4 4.4 4.2  CL 106 105 102  CO2 31 33* 36*  BUN 25* 24* 19  CREATININE 0.55 0.50 0.46*  GLUCOSE 128* 162* 145*   Electrolytes  Recent Labs Lab 01/19/13 0430 01/20/13 0430 01/21/13 0227  CALCIUM 7.8* 8.1* 8.4  MG  --   --  2.0   Sepsis Markers No results found for this basename: LATICACIDVEN, PROCALCITON, O2SATVEN,  in the last 168 hours ABG  Recent Labs Lab 01/17/13 0205 01/18/13 1533 01/19/13 0420  PHART 7.367 7.288* 7.422  PCO2ART 48.9* 68.9* 48.8*  PO2ART 178.0* 439.0* 84.7   Liver Enzymes  Recent Labs Lab 01/19/13 0430  AST 43*  ALT 60*  ALKPHOS 54  BILITOT 0.6  ALBUMIN 2.7*  Cardiac Enzymes No results found for this basename: TROPONINI, PROBNP,  in the last 168 hours Glucose  Recent Labs Lab 01/20/13 0738 01/20/13 1144 01/20/13 2019 01/20/13 2253 01/21/13 0708 01/21/13 1108  GLUCAP 154* 157* 144* 137* 136* 126*   CXR:no film  ASSESSMENT / PLAN:  PULMONARY  A:  Acute on chronic respiratory failure   AECOPD. RUL pneumonia.HCAP P:  Titrate oxygen as tolerated  Brovana/Pulmocort neb Q12 Solumedrol > changed to pred 1/18  CARDIOVASCULAR  A:  Hemodynamically stable, no arrhythmia / ischemia. P:  ASA. Lipitor, Cardizem, Imdur   RENAL  A:  No active issues. P:   Minimize labs  GASTROINTESTINAL  A:  Oral candidiasis. Nutrition. GERD on PPI. Moderate Malnutrition P:  Adv diet  HEMATOLOGIC  A:   Stable anemia. VTE Px. P:  Trend CBC  Lovenox  INFECTIOUS  A:  CAP. Possible HCAP Influenza neg P:  Abx / cx as above    ENDOCRINE  A:  Normoglycemia. P:  CBG  NEUROLOGIC  A:  Acute encephalopathy/ resolving. P:  Remains on PRN morphine for analgesia.    Joneen Roach, ACNP [place] Pulmonology/Critical Care Pager [phone] or [phone]  Reviewed above, examined pt, and agree with assessment/plan.  Awaiting home hospice arrangements.  Coralyn Helling, MD [facility] Pulmonary/Critical Care 01/23/2013, 1:52 PM Pager:  [phone] After 3pm call: [phone]

## 2013-01-24 MED ORDER — SCOPOLAMINE 1 MG/3DAYS TD PT72: 1.0000 | MEDICATED_PATCH | TRANSDERMAL | Status: DC

## 2013-01-24 MED ORDER — MORPHINE SULFATE 20 MG/5ML PO SOLN: 10.0000 mg | ORAL | Status: AC | PRN

## 2013-01-24 MED ORDER — LEVOFLOXACIN 750 MG PO TABS: 750.0000 mg | ORAL_TABLET | ORAL | Status: DC

## 2013-01-24 NOTE — Care Management Note (Addendum)
   CARE MANAGEMENT NOTE 01/24/2013  Patient:  Kaitlyn Snyder, Kaitlyn Snyder   Account Number:  [id number]  Date Initiated:  01/16/2013  Documentation initiated by:  Marvetta Gibbons  Subjective/Objective Assessment:   Pt admitted with COPD     Action/Plan:   PTA pt lived at home- NCM to follow for d/c needs  01/23/13 Pt daughter [facility].  [facility] notifed.  01/24/2013 DME ordered, Package D, ATM mattress and 3:1 commode, all delivered to home.  Anticipated DC Date:  01/24/2013   Anticipated DC Plan:  [place]  CM consult      Choice offered to / List presented to:          [facility] arranged  HH-1 RN  HH-4 NURSE'S AIDE  HH-6 SOCIAL WORKER      HH agency  [facility] [place]   Status of service:  Completed, signed off Medicare Important Message given?   (If response is "NO", the following Medicare IM given date fields will be blank) Date Medicare IM given:   Date Additional Medicare IM given:    Discharge Disposition:  [place]  Per UR Regulation:  Reviewed for med. necessity/level of care/duration of stay  If discussed at [place] of Stay Meetings, dates discussed:    Comments:  ContactMargo Common Daughter [phone] 01/24/2013 Spoke with [facility] rep, Darian @ [phone] re DME needs, and per family the plan was for DME to be delivered this pm between 1300-1500. Pt will be transported by ambulance to home, medication orders reviewed by hospice rep, Mellody Memos. DNR to be signed by MD and pt d/c this pm. Jasmine Pang RN MPH, [phone]  01-18-13 2:50pm Shanna Cisco [phone] Patient continued to deteriorate - required intubation and tx to ICU.

## 2013-01-24 NOTE — Progress Notes (Signed)
Nutrition Brief Note  Chart reviewed. Pt transferred to floor on 1/17 at NCB status. Pt now planning to d/c with home hospice. No further nutrition interventions warranted at this time.  Please re-consult as needed.   Jarold MottoSamantha Driana Dazey MS, RD, LDN Pager: [phone] After-hours pager: [phone]

## 2013-01-24 NOTE — Discharge Summary (Signed)
Physician Discharge Summary  Patient ID: Kaitlyn Snyder MRN: [id number] DOB/AGE: 76/13/1937 76 y.o.  Admit date: 01/15/2013 Discharge date: 01/24/2013    Discharge Diagnoses:  Active Problems:   Pulmonary nodules   Acute-on-chronic respiratory failure   COPD with acute exacerbation   Dehydration   Low urine output   Tobacco abuse   CAP (community acquired pneumonia)   Palliative care patient    Brief Summary: Kaitlyn Snyder is a 78 y.o. y/o female with a PMH of COPD, Hyperlipidemia, squamous carcinoma, and anemia who was admitted 01/15/2013 with acute on chronic respiratory failure in the setting of AECOPD. She was initially admitted 1/11 to step down and placed on BiPAP. 1/14 she began to decompensate despite BiPAP. After being discussed with family the decision was made treat her aggressively for acute respiratory failure which included intubation, mechanical ventilation, and a transfer to the ICU. She was extubated on 1/16 with no reintubation and moved to the floor for comfort measure. It was decided that at this point it was decided to change code status to DNR. 1/18 She will be discharged to home with hospice and comfort directed care.    Consults:   Lines/tubes: OETT 1/14 > 1/16  OGT 1/14 >1/16  L IJ CVL 1/14 >1/20 Foley 1/14 >1/20   Microbiology/Sepsis markers: 1/11 Sputum >>> neg  1/14 MRSA >>>neg  1/14 Urine >>> neg  1/14 Respiratory >>> neg 1/14 Viral screen (tracheal ) >>> neg  1/16 Blood >>>neg   Significant Diagnostic Studies:                                                                    D/c plan by Discharge Diagnosis  Acute on chronic resp failure  COPD  HCAP  D/c plan --  PRN roxanol  --adjustments/change in rx per hospice (Dr. Halford Chessman will be hospice MD)  Symptom management  tussionex for cough as long as awake enough to take  Cont O2 - d/w family to d/c if bothersome to pt  Cont BD  scopolamine patch per hospice if indicated as she  declines  S/p course abx    No other meds/interventions unless needed for comfort.    Filed Vitals:   01/23/13 2212 01/23/13 2216 01/23/13 2246 01/24/13 1000  BP:   122/51 117/47  Pulse:   73 69  Temp:   97.4 F (36.3 C) 97.6 F (36.4 C)  TempSrc:   Oral Oral  Resp:   18 18  Height:      Weight:   122 lb 14.4 oz (55.747 kg)   SpO2: 98% 97% 100% 99%     Discharge Labs  BMET  Recent Labs Lab 01/18/13 0237 01/19/13 0430 01/20/13 0430 01/21/13 0227  NA 142 146 146 145  K 4.6 4.4 4.4 4.2  CL 106 106 105 102  CO2 26 31 33* 36*  GLUCOSE 114* 128* 162* 145*  BUN 25* 25* 24* 19  CREATININE 0.47* 0.55 0.50 0.46*  CALCIUM 7.8* 7.8* 8.1* 8.4  MG  --   --   --  2.0     CBC   Recent Labs Lab 01/19/13 0430 01/20/13 0430 01/21/13 0227  HGB 11.8* 12.0 12.7  HCT 36.2 36.7 39.2  WBC 7.1 8.6 13.1*  PLT 176 163 162   Anti-Coagulation No results found for this basename: INR,  in the last 168 hours       Follow-up Information   Follow up with [facility] ([facility] to follow aftr d/c pls notify when pt ready to leave unit call [phone] (or if aftr 5 pm [phone]))    Contact information:   [place], [place]       Medication List    STOP taking these medications       aspirin 81 MG tablet     atorvastatin 20 MG tablet  Commonly known as:  LIPITOR     azithromycin 250 MG tablet  Commonly known as:  ZITHROMAX     buPROPion 100 MG 12 hr tablet  Commonly known as:  WELLBUTRIN SR     CALTRATE 600+D 600-400 MG-UNIT per tablet  Generic drug:  Calcium Carbonate-Vitamin D     cholecalciferol 1000 UNITS tablet  Commonly known as:  VITAMIN D     diltiazem 120 MG 24 hr capsule  Commonly known as:  DILACOR XR     FLUoxetine 20 MG tablet  Commonly known as:  PROZAC     fluticasone-salmeterol 115-21 MCG/ACT inhaler  Commonly known as:  ADVAIR HFA     ibandronate 150 MG tablet  Commonly  known as:  BONIVA     isosorbide mononitrate 30 MG 24 hr tablet  Commonly known as:  IMDUR     nitroGLYCERIN 0.4 MG SL tablet  Commonly known as:  NITROSTAT     omeprazole 20 MG capsule  Commonly known as:  PRILOSEC     tiotropium 18 MCG inhalation capsule  Commonly known as:  SPIRIVA     VITAMIN B 12 PO      TAKE these medications       acetaminophen 325 MG tablet  Commonly known as:  TYLENOL  Take 2 tablets (650 mg total) by mouth every 6 (six) hours as needed for mild pain (or Fever >/= 101).     acetaminophen 650 MG suppository  Commonly known as:  TYLENOL  Place 1 suppository (650 mg total) rectally every 6 (six) hours as needed for mild pain (or Fever >/= 101).     albuterol 108 (90 BASE) MCG/ACT inhaler  Commonly known as:  PROVENTIL HFA;VENTOLIN HFA  Inhale 2 puffs into the lungs every 4 (four) hours as needed.     albuterol (2.5 MG/3ML) 0.083% nebulizer solution  Commonly known as:  PROVENTIL  Take 3 mLs (2.5 mg total) by nebulization every 6 (six) hours as needed for wheezing or shortness of breath.     arformoterol 15 MCG/2ML Nebu  Commonly known as:  BROVANA  Take 2 mLs (15 mcg total) by nebulization every 12 (twelve) hours.     budesonide 0.5 MG/2ML nebulizer solution  Commonly known as:  PULMICORT  Take 2 mLs (0.5 mg total) by nebulization every 12 (twelve) hours.     chlorpheniramine-HYDROcodone 10-8 MG/5ML Lqcr  Commonly known as:  TUSSIONEX  Take 5 mLs by mouth every 12 (twelve) hours as needed for cough.     levofloxacin 750 MG tablet  Commonly known as:  LEVAQUIN  Take 1 tablet (750 mg total) by mouth every other day.  Start taking on:  01/25/2013     morphine CONCENTRATE 10 mg / 0.5 ml concentrated solution  Place 0.5 mLs (10 mg total) under the tongue every 3 (three) hours as needed for  moderate pain, severe pain or shortness of breath.     ondansetron 8 MG disintegrating tablet  Commonly known as:  ZOFRAN ODT  Take 1 tablet (8 mg total) by  mouth every 8 (eight) hours as needed for nausea or vomiting.     predniSONE 20 MG tablet  Commonly known as:  DELTASONE  4 tabs PO daily x 3 days then 3 tabs PO daily x 3 days then 2 tabs PO daily x 3 days then 1 tab PO daily x 3 days then STOP     scopolamine 1.5 MG  Commonly known as:  TRANSDERM-SCOP  Place 1 patch (1.5 mg total) onto the skin every 3 (three) days.          Disposition: 06-Home-Health Care Svc  Discharged Condition: Kaitlyn Snyder has met maximum benefit of inpatient care and is medically stable and cleared for discharge.  Patient is pending follow up as above.      Time spent on disposition:  Greater than 35 minutes.   Signed: Georgann Housekeeper, ACNP [place] Pulmonology/Critical Care Pager [phone] or [phone]    *Care during the described time interval was provided by me and/or other providers on the critical care team. I have reviewed this patient's available data, including medical history, events of note, physical examination and test results as part of my evaluation.  Chesley Mires, MD [facility] Pulmonary/Critical Care 01/24/2013, 11:45 AM Pager:  [phone] After 3pm call: [phone]

## 2013-01-25 NOTE — Progress Notes (Signed)
Pt. discharged to home. Discharge paper and work along with her prescriptions were reviewed and given to her daughter. Pt was transported home by ambulance. Laverda SorensonATJANA Viviann Broyles, RN

## 2013-01-26 LAB — CULTURE, BLOOD (ROUTINE X 2): Culture: NO GROWTH

## 2013-01-28 ENCOUNTER — Other Ambulatory Visit: Payer: Self-pay | Admitting: Interventional Cardiology

## 2013-02-03 IMAGING — CR DG CHEST 2V
2 series · 2 of 2 positions shown · non-contrast
Comparison: 12/03/2010

CLINICAL DATA: Rule out infiltrate

CHEST - 2 VIEW

[w chest pa]
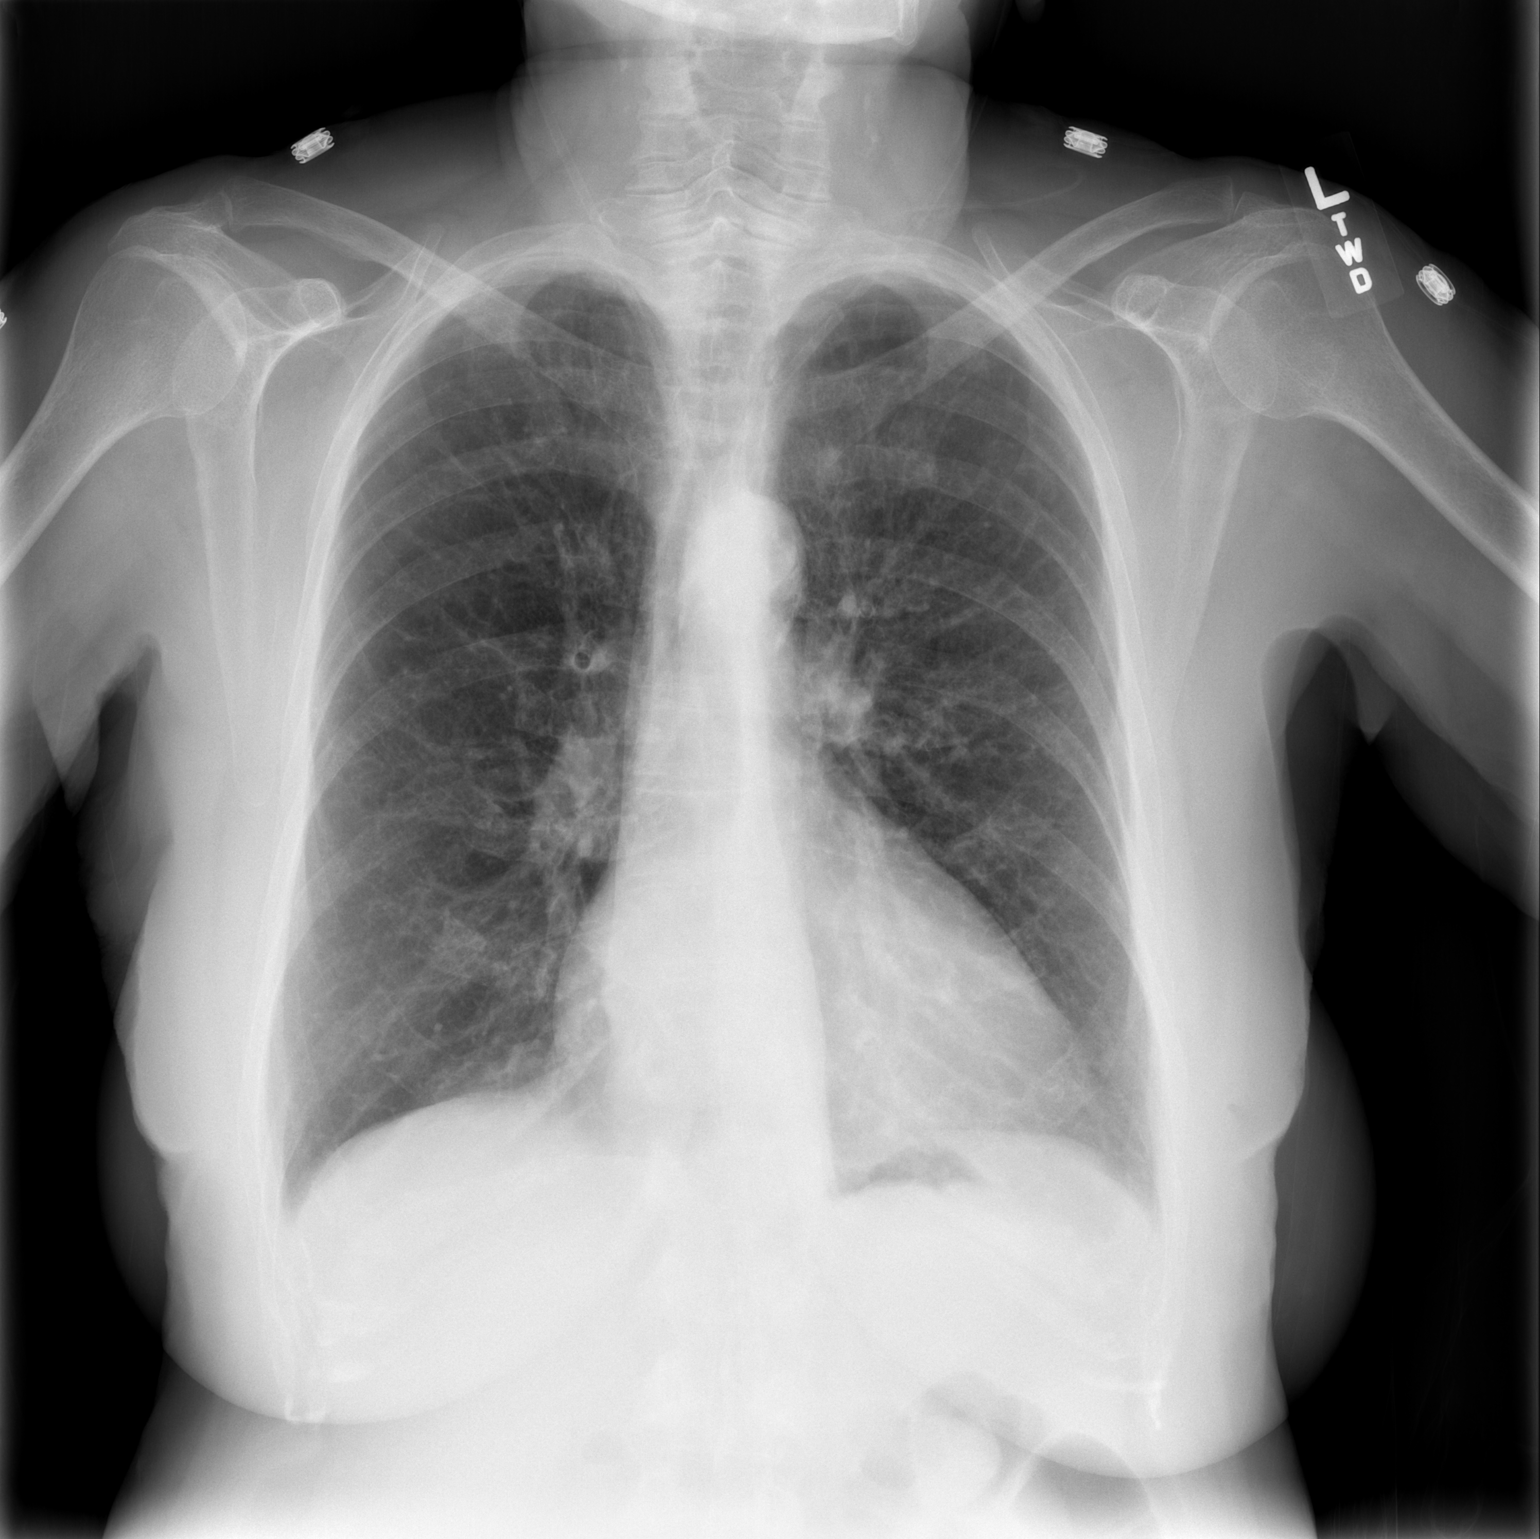

[w chest lat]
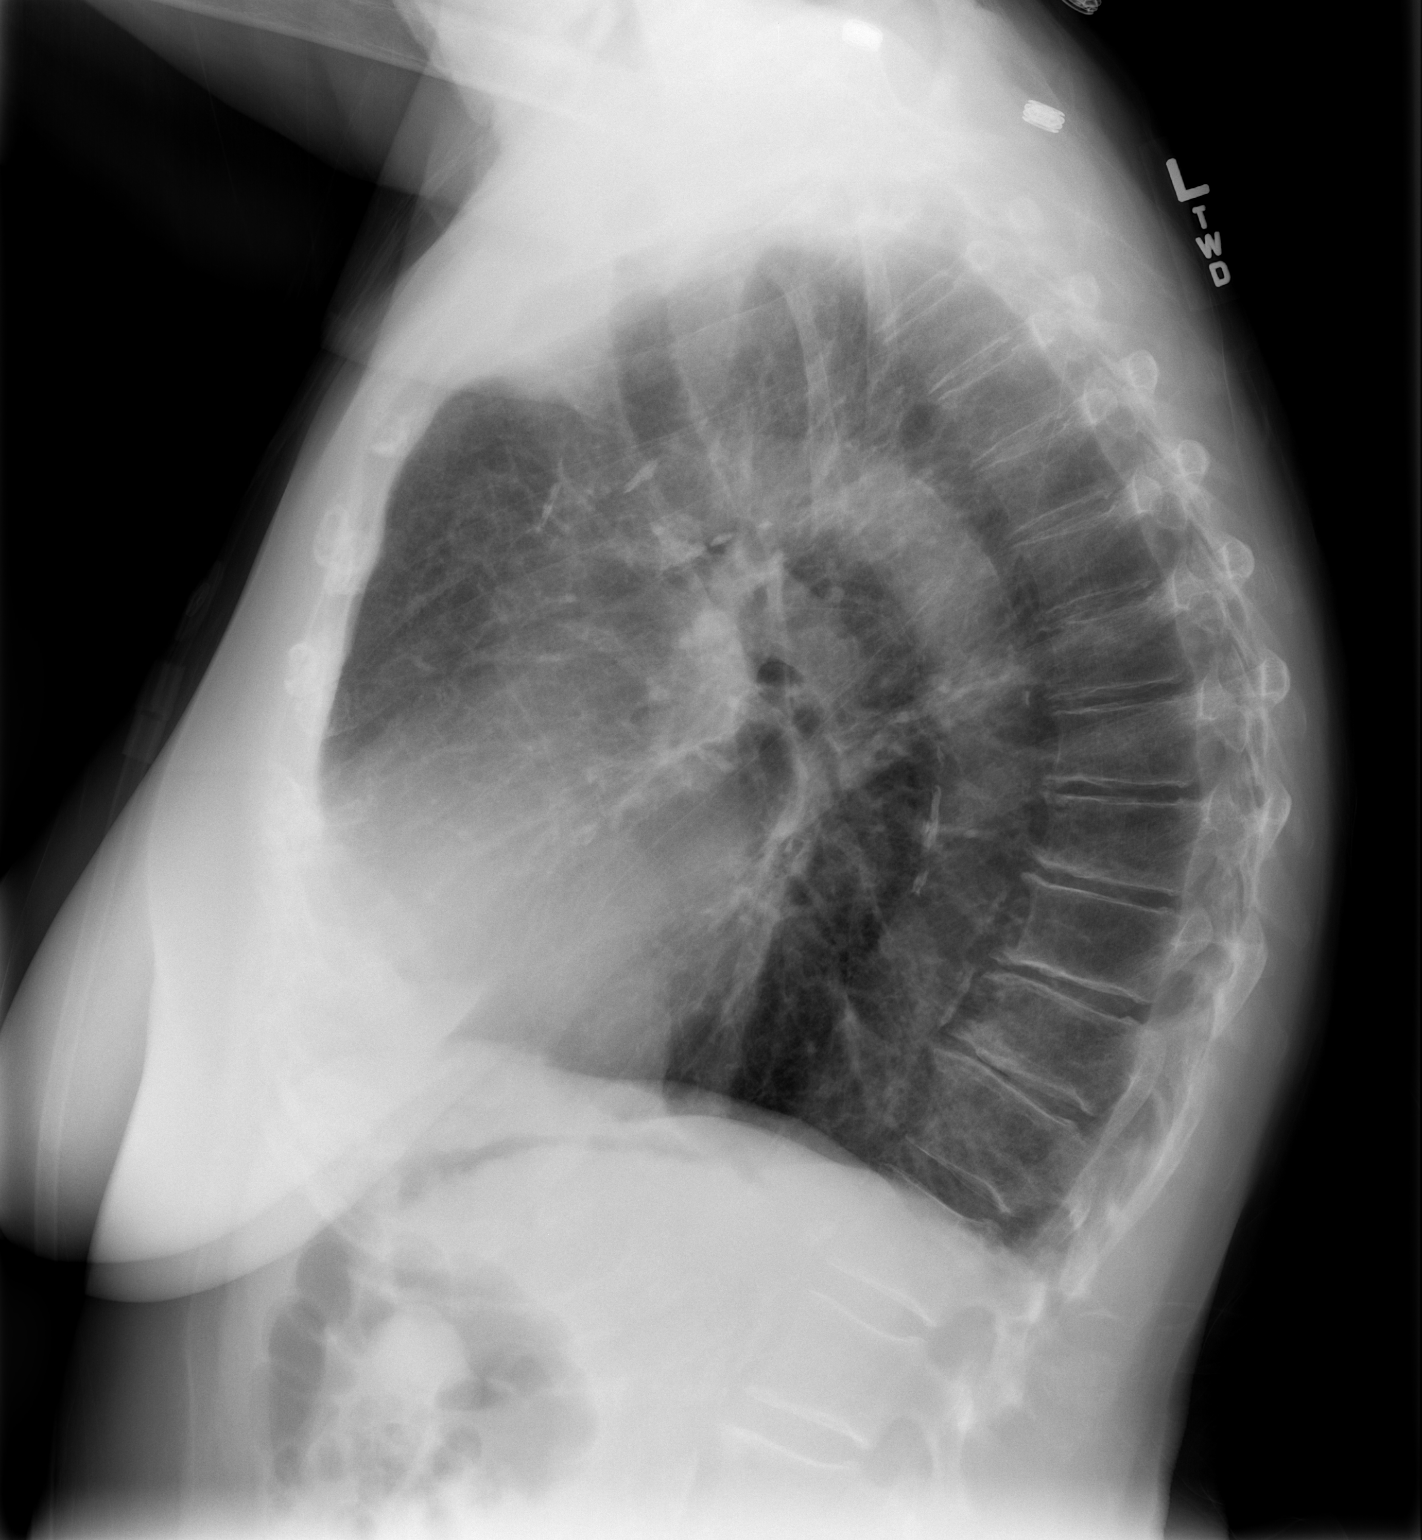

[2 of 2 positions shown; findings below may reference images not displayed]

FINDINGS: Cardiomediastinal silhouette is stable.  Mild
hyperinflation again noted.  Atherosclerotic calcifications of
thoracic aorta.  Mild degenerative changes thoracic spine.  No
acute infiltrate or pleural effusion.  No pulmonary edema.
IMPRESSION: No active disease.  Mild hyperinflation again noted.  Mild
degenerative changes thoracic spine.

## 2013-06-13 ENCOUNTER — Other Ambulatory Visit: Payer: Self-pay | Admitting: Interventional Cardiology

## 2013-09-18 ENCOUNTER — Other Ambulatory Visit: Payer: Self-pay | Admitting: Interventional Cardiology

## 2013-10-05 ENCOUNTER — Other Ambulatory Visit: Payer: Self-pay | Admitting: Interventional Cardiology

## 2013-10-23 ENCOUNTER — Ambulatory Visit: Admitting: Interventional Cardiology

## 2013-11-02 ENCOUNTER — Other Ambulatory Visit: Payer: Self-pay | Admitting: Interventional Cardiology

## 2013-11-17 ENCOUNTER — Other Ambulatory Visit: Payer: Self-pay | Admitting: Interventional Cardiology

## 2013-11-17 NOTE — Telephone Encounter (Signed)
She needs to be seen . OK to refill for 30 days.

## 2013-11-17 NOTE — Telephone Encounter (Signed)
Patient still has not scheduled an appointment. Please advise if ok to refill or deny. Thanks, MI

## 2013-11-17 NOTE — Telephone Encounter (Signed)
iok to fill for 30 days.  She should be seen or she can have her PMD fill the medicine.

## 2013-11-17 NOTE — Telephone Encounter (Signed)
Spoke with this patient and she will need to get transportation to the office, so she will call back next week to schedule.

## 2013-11-17 NOTE — Telephone Encounter (Signed)
OK to refill for 30 days, but she should be seen.

## 2013-11-17 NOTE — Telephone Encounter (Signed)
I called this patient to see if she was able to come in to see you. She is still in Hospice care. PCP dr. Corliss BlackerMcneill refilled her isosorbide. Do you need to see her?

## 2013-11-22 ENCOUNTER — Other Ambulatory Visit: Payer: Self-pay

## 2013-11-22 NOTE — Telephone Encounter (Signed)
ok to fill for 30 days. She should be seen here and we could give more refills OR she can have her PMD fill the medicine longterm.

## 2013-11-29 ENCOUNTER — Emergency Department (INDEPENDENT_AMBULATORY_CARE_PROVIDER_SITE_OTHER)

## 2013-11-29 ENCOUNTER — Emergency Department (INDEPENDENT_AMBULATORY_CARE_PROVIDER_SITE_OTHER)
Admission: EM | Admit: 2013-11-29 | Discharge: 2013-11-29 | Disposition: A | Discharge status: Home / Self Care | Source: Home / Self Care | Attending: Emergency Medicine | Admitting: Emergency Medicine

## 2013-11-29 ENCOUNTER — Encounter: Payer: Self-pay | Admitting: Interventional Cardiology

## 2013-11-29 ENCOUNTER — Ambulatory Visit (INDEPENDENT_AMBULATORY_CARE_PROVIDER_SITE_OTHER): Admitting: Interventional Cardiology

## 2013-11-29 VITALS — BP 115/60 | HR 87 | Ht 62.0 in | Wt 108.8 lb

## 2013-11-29 DIAGNOSIS — I2584 Coronary atherosclerosis due to calcified coronary lesion: Secondary | ICD-10-CM

## 2013-11-29 DIAGNOSIS — M79673 Pain in unspecified foot: Secondary | ICD-10-CM

## 2013-11-29 DIAGNOSIS — I2510 Atherosclerotic heart disease of native coronary artery without angina pectoris: Secondary | ICD-10-CM

## 2013-11-29 DIAGNOSIS — S92301A Fracture of unspecified metatarsal bone(s), right foot, initial encounter for closed fracture: Secondary | ICD-10-CM

## 2013-11-29 DIAGNOSIS — I951 Orthostatic hypotension: Secondary | ICD-10-CM

## 2013-11-29 DIAGNOSIS — E785 Hyperlipidemia, unspecified: Secondary | ICD-10-CM | POA: Insufficient documentation

## 2013-11-29 DIAGNOSIS — I25709 Atherosclerosis of coronary artery bypass graft(s), unspecified, with unspecified angina pectoris: Secondary | ICD-10-CM

## 2013-11-29 MED ORDER — DILTIAZEM HCL ER 120 MG PO CP24: ORAL_CAPSULE | ORAL | Status: AC

## 2013-11-29 MED ORDER — ISOSORBIDE MONONITRATE ER 30 MG PO TB24: 30.0000 mg | ORAL_TABLET | Freq: Every day | ORAL | Status: AC

## 2013-11-29 MED ORDER — HYDROCODONE-ACETAMINOPHEN 5-325 MG PO TABS: 1.0000 | ORAL_TABLET | Freq: Four times a day (QID) | ORAL | Status: AC | PRN

## 2013-11-29 NOTE — Telephone Encounter (Signed)
Ok to refill 

## 2013-11-29 NOTE — ED Notes (Addendum)
See providers note  Deneise Leveresiree Brown, CMA

## 2013-11-29 NOTE — Patient Instructions (Signed)
Your physician recommends that you continue on your current medications as directed. Please refer to the Current Medication list given to you today.  No follow up is needed at this time with Dr.  Varanasi. He will see you on an as needed basis. 

## 2013-11-29 NOTE — ED Provider Notes (Signed)
CSN: [id number]     Arrival date & time 11/29/13  1225 History   First MD Initiated Contact with Patient 11/29/13 1358     No chief complaint on file. CC: right foot pain after a fall  (Consider location/radiation/quality/duration/timing/severity/associated sxs/prior Treatment) HPI  She is a 78 year old woman here with her daughter and daughter-in-law for evaluation of right foot pain. On Monday, she was rushing to answer the phone when she tripped and twisted her right foot. She states she heard a pop. She had immediate pain and swelling. The pain is on the lateral aspect of the forefoot. She is able to bear weight, but it is very painful. She denies actually falling. No head trauma or loss of consciousness. Daughter and daughter-in-law state she is at her baseline mentally.  No known history of osteoporosis, but she does have multiple risk factors with smoking, COPD, PPI use.  Past Medical History  Diagnosis Date  . COPD (chronic obstructive pulmonary disease)   . Hyperlipidemia   . Depression   . Squamous carcinoma   . Anxiety   . Anemia, B12 deficiency    Past Surgical History  Procedure Laterality Date  . Abdominal hysterectomy    . Cholecystectomy    . Gallbladder surgery     Family History  Problem Relation Age of Onset  . Kidney failure Brother   . Emphysema Father    History  Substance Use Topics  . Smoking status: Current Some Day Smoker -- 0.50 packs/day for 55 years    Types: Cigarettes    Last Attempt to Quit: 12/03/2010  . Smokeless tobacco: Never Used  . Alcohol Use: 3.6 oz/week    6 Cans of beer per week     Comment: beer q week   OB History    No data available     Review of Systems  Musculoskeletal:       Right foot pain    Allergies  Review of patient's allergies indicates no known allergies.  Home Medications   Prior to Admission medications   Medication Sig Start Date End Date Taking? Authorizing Provider  albuterol (PROVENTIL  HFA;VENTOLIN HFA) 108 (90 BASE) MCG/ACT inhaler Inhale 2 puffs into the lungs every 4 (four) hours as needed.      Historical Provider, MD  arformoterol (BROVANA) 15 MCG/2ML NEBU Take 2 mLs (15 mcg total) by nebulization every 12 (twelve) hours. 01/23/13   Bernadene PersonKathryn A Whiteheart, NP  budesonide (PULMICORT) 0.5 MG/2ML nebulizer solution Take 2 mLs (0.5 mg total) by nebulization every 12 (twelve) hours. 01/23/13   Bernadene PersonKathryn A Whiteheart, NP  chlorpheniramine-HYDROcodone (TUSSIONEX) 10-8 MG/5ML LQCR Take 5 mLs by mouth every 12 (twelve) hours as needed for cough. 01/23/13   Bernadene PersonKathryn A Whiteheart, NP  diltiazem (DILT-XR) 120 MG 24 hr capsule TAKE ONE CAPSULE BY MOUTH EVERY MORNING ON AN EMPTY STOMACH. 11/29/13   Corky CraftsJayadeep S Varanasi, MD  haloperidol (HALDOL) 2 MG tablet  10/30/13   Historical Provider, MD  HYDROcodone-acetaminophen (NORCO) 5-325 MG per tablet Take 1 tablet by mouth every 6 (six) hours as needed for moderate pain. 11/29/13   Charm RingsErin J Honig, MD  isosorbide mononitrate (IMDUR) 30 MG 24 hr tablet Take 1 tablet (30 mg total) by mouth daily. 11/29/13   Corky CraftsJayadeep S Varanasi, MD  LORazepam (ATIVAN) 1 MG tablet Take 1 mg by mouth every 4 (four) hours as needed for anxiety.    Historical Provider, MD  morphine 20 MG/5ML solution Take 2.5 mLs (10 mg total) by mouth  every 3 (three) hours as needed for pain (PRN Pain, SOB). 01/24/13   Duayne CalPaul W Hoffman, NP  omeprazole (PRILOSEC) 40 MG capsule Take 40 mg by mouth daily.    Historical Provider, MD  polyethylene glycol powder (GLYCOLAX/MIRALAX) powder  10/20/13   Historical Provider, MD  predniSONE (DELTASONE) 10 MG tablet Take 10 mg by mouth daily with breakfast.    Historical Provider, MD  sertraline (ZOLOFT) 100 MG tablet Take 100 mg by mouth daily.    Historical Provider, MD  WAL-TUSSIN 100 MG/5ML syrup  09/17/13   Historical Provider, MD   BP 116/78 mmHg  Pulse 107  Temp(Src) 98.7 F (37.1 C) (Oral)  Resp 20  SpO2 95% Physical Exam  Constitutional:  Elderly  woman on chronic oxygen therapy  Pulmonary/Chest: Effort normal.  Nasal cannula in place  Musculoskeletal:  Right foot: swelling and bruising over lateral aspect of foot.  No ankle tenderness or swelling.  Tender over distal 5th metatarsal.  Brisk cap refill in all digits.  Neurological: She is alert.    ED Course  Procedures (including critical care time) Labs Review Labs Reviewed - No data to display  Imaging Review No results found.   MDM   1. Fracture of 5th metatarsal, right, closed, initial encounter   2. Foot pain    X-ray reviewed and there is a comminuted fracture of the fifth metatarsal. Spoke with Dr. Dion SaucierLandau, who recommended Cam Walker and follow-up with him next week. She has both a wheelchair and a walker at home that she will use. And for Norco to use as needed for pain. I commended ice and elevation to help with the swelling.    Charm RingsErin J Honig, MD 11/29/13 [phone]

## 2013-11-29 NOTE — Progress Notes (Signed)
Patient ID: Kaitlyn FurlongLinda Jones Snyder, female   DOB: 06/10/1935, 78 y.o.   MRN: [id number]    [address] [place], [place]  [address] Phone: [phone]) [phone] Fax:  [phone]) [phone]  Date:  11/29/2013   ID:  Kaitlyn FurlongLinda Jones Snyder, DOB 02/06/1935, MRN [id number]  PCP:  Gweneth DimitriMCNEILL,WENDY, MD      History of Present Illness: Kaitlyn Snyder is a 78 y.o. female were seen in the past due to coronary calcifications. She had a negative stress test in 2011. She continued to have exertional shortness of breath. Imdur was started. Since I last saw her, she is now using oxygen 24 hours a day. Her COPD has gotten worse to the point where she is now on hospice. She denies any chest discomfort. Her mobility is limited. She does not drink a lot of water to stay hydrated. She is having some difficulty eating in general. She has lost significant weight in the past year as well. She does try to drink protein shakes to maintain her nutrition.   Wt Readings from Last 3 Encounters:  11/29/13 108 lb 12.8 oz (49.351 kg)  01/23/13 122 lb 14.4 oz (55.747 kg)  06/29/12 124 lb (56.246 kg)     Past Medical History  Diagnosis Date  . COPD (chronic obstructive pulmonary disease)   . Hyperlipidemia   . Depression   . Squamous carcinoma   . Anxiety   . Anemia, B12 deficiency     Current Outpatient Prescriptions  Medication Sig Dispense Refill  . albuterol (PROVENTIL HFA;VENTOLIN HFA) 108 (90 BASE) MCG/ACT inhaler Inhale 2 puffs into the lungs every 4 (four) hours as needed.      Marland Kitchen. arformoterol (BROVANA) 15 MCG/2ML NEBU Take 2 mLs (15 mcg total) by nebulization every 12 (twelve) hours. 120 mL 0  . budesonide (PULMICORT) 0.5 MG/2ML nebulizer solution Take 2 mLs (0.5 mg total) by nebulization every 12 (twelve) hours. 120 mL 12  . chlorpheniramine-HYDROcodone (TUSSIONEX) 10-8 MG/5ML LQCR Take 5 mLs by mouth every 12 (twelve) hours as needed for cough. 115 mL 0  . DILT-XR 120 MG 24 hr capsule TAKE ONE CAPSULE BY MOUTH  EVERY MORNING ON AN EMPTY STOMACH. 30 capsule 0  . haloperidol (HALDOL) 2 MG tablet   3  . isosorbide mononitrate (IMDUR) 30 MG 24 hr tablet TAKE 1 TABLET BY MOUTH EVERY DAY 30 tablet 0  . LORazepam (ATIVAN) 1 MG tablet Take 1 mg by mouth every 4 (four) hours as needed for anxiety.    Marland Kitchen. morphine 20 MG/5ML solution Take 2.5 mLs (10 mg total) by mouth every 3 (three) hours as needed for pain (PRN Pain, SOB). 30 mL 0  . omeprazole (PRILOSEC) 40 MG capsule Take 40 mg by mouth daily.    . polyethylene glycol powder (GLYCOLAX/MIRALAX) powder   2  . predniSONE (DELTASONE) 10 MG tablet Take 10 mg by mouth daily with breakfast.    . sertraline (ZOLOFT) 100 MG tablet Take 100 mg by mouth daily.    . WAL-TUSSIN 100 MG/5ML syrup   0   No current facility-administered medications for this visit.    Allergies:   No Known Allergies  Social History:  The patient  reports that she has been smoking Cigarettes.  She has a 27.5 pack-year smoking history. She has never used smokeless tobacco. She reports that she drinks about 3.6 oz of alcohol per week. She reports that she does not use illicit drugs.   Family History:  The patient's family  history includes Emphysema in her father; Kidney failure in her brother.   ROS:  Please see the history of present illness.  No nausea, vomiting.  No fevers, chills.  No focal weakness.  No dysuria.   All other systems reviewed and negative.   PHYSICAL EXAM: VS:  BP 115/60 mmHg  Pulse 87  Ht 5\' 2"  (1.575 m)  Wt 108 lb 12.8 oz (49.351 kg)  BMI 19.89 kg/m2 General: Well developed, frail, in no acute distress HEENT: normal Neck: no JVD, no carotid bruits Cardiac:  normal S1, S2; RRR;  Lungs:  clear to auscultation bilaterally, no wheezing, rhonchi or rales Abd: soft, nontender, no hepatomegaly Ext: no edema Skin: warm and dry Neuro:   no focal abnormalities noted Psych: normal affect  EKG:  NSR, septal Q waves, NSST   ASSESSMENT AND PLAN:  1. Coronary  calcifications: Symptoms controlled on medical therapy. More pressing issue now is her lung disease. She is on hospice care for this. She is on continuous oxygen. Her cardiac symptoms are not an issue. Continue diltiazem and isosorbide.  Will refill both today. 2. She has some lightheadedness with standing. I encouraged her to try to stay well hydrated. If her blood pressure begins to get too low, we would have to consider stopping her diltiazem. She has seen weekly by a hospice nurse who checks her blood pressure.  Family did not know the blood pressure readings at this time. 3. Hyperlipidemia: She had been on atorvastatin in the past. This was started last year. It is no longer on her medication list. It may have been stopped due to her overall condition. I will defer to her primary care doctor regarding treatment of her lipids. Given her coronary calcifications, we will try to get her LDL below 100. Her LDL 90 in the past. However,  her overall clinical situation may not warrant risk factor modification with lipid lowering therapy.  Signed, Fredric MareJay S. Varanasi, MD, [facility] 11/29/2013 11:29 AM

## 2013-11-29 NOTE — Discharge Instructions (Signed)
You have a broken bone in your foot. Wear the boot all the time. Use your wheelchair. Take norco as needed for pain. Apply ice 2-3 times a day and keep the foot elevated. Call Dr. Shelba FlakeLandau's office for an appointment next week.

## 2014-02-05 DEATH — deceased

## 2015-03-17 IMAGING — DX DG ABD PORTABLE 1V
1 series · 1 of 1 positions shown · non-contrast
Comparison: September 19, 2009

CLINICAL DATA: Orogastric tube placement

EXAM:
PORTABLE ABDOMEN - 1 VIEW

[supine ap]
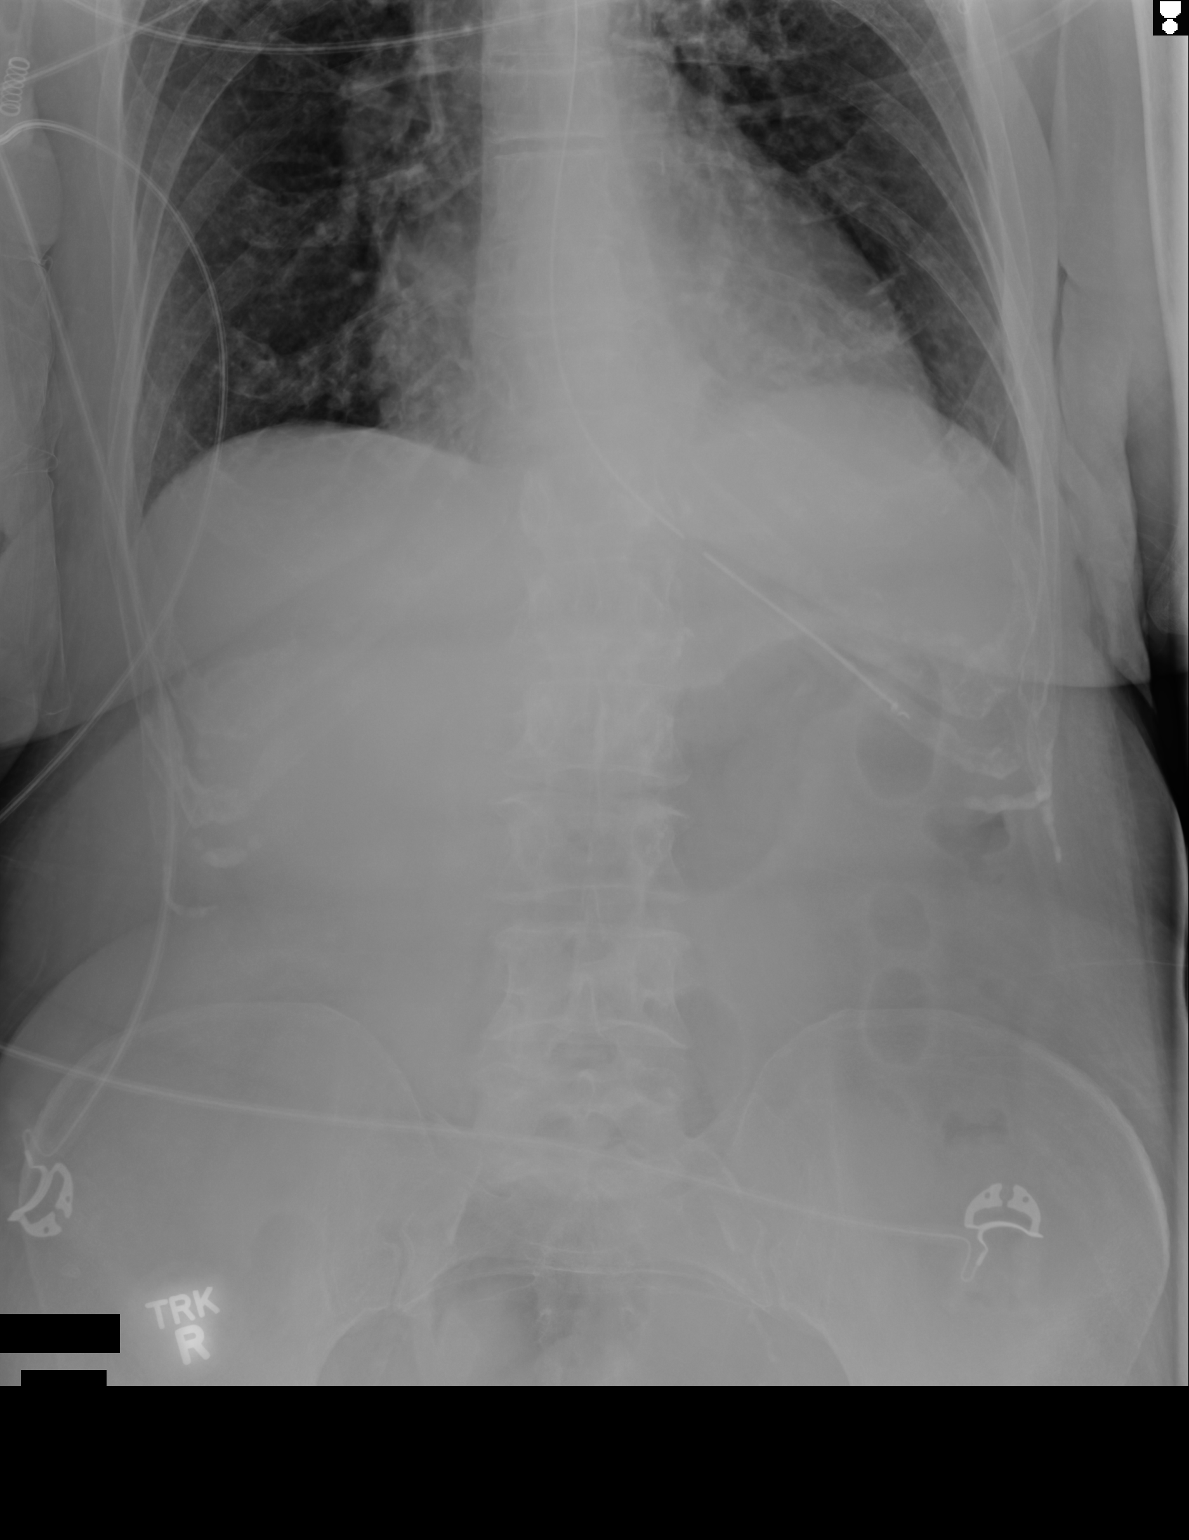

[1 of 1 positions shown; findings below may reference images not displayed]

FINDINGS: Orogastric tube tip and side port are in the proximal stomach. Bowel
gas pattern is normal. No obstruction or free air is seen on this
supine examination. There are clips in the pelvis region. There is
vascular calcification in the pelvis.
IMPRESSION: Orogastric tube tip and side port in proximal stomach. Bowel gas
pattern unremarkable.

## 2015-03-20 IMAGING — CR DG CHEST 1V PORT
1 series · 1 of 1 positions shown · non-contrast
Comparison: Portable chest x-ray 20 January, 2013

CLINICAL DATA: Suspected right upper lobe pneumonia

EXAM:
PORTABLE CHEST - 1 VIEW

[AP]
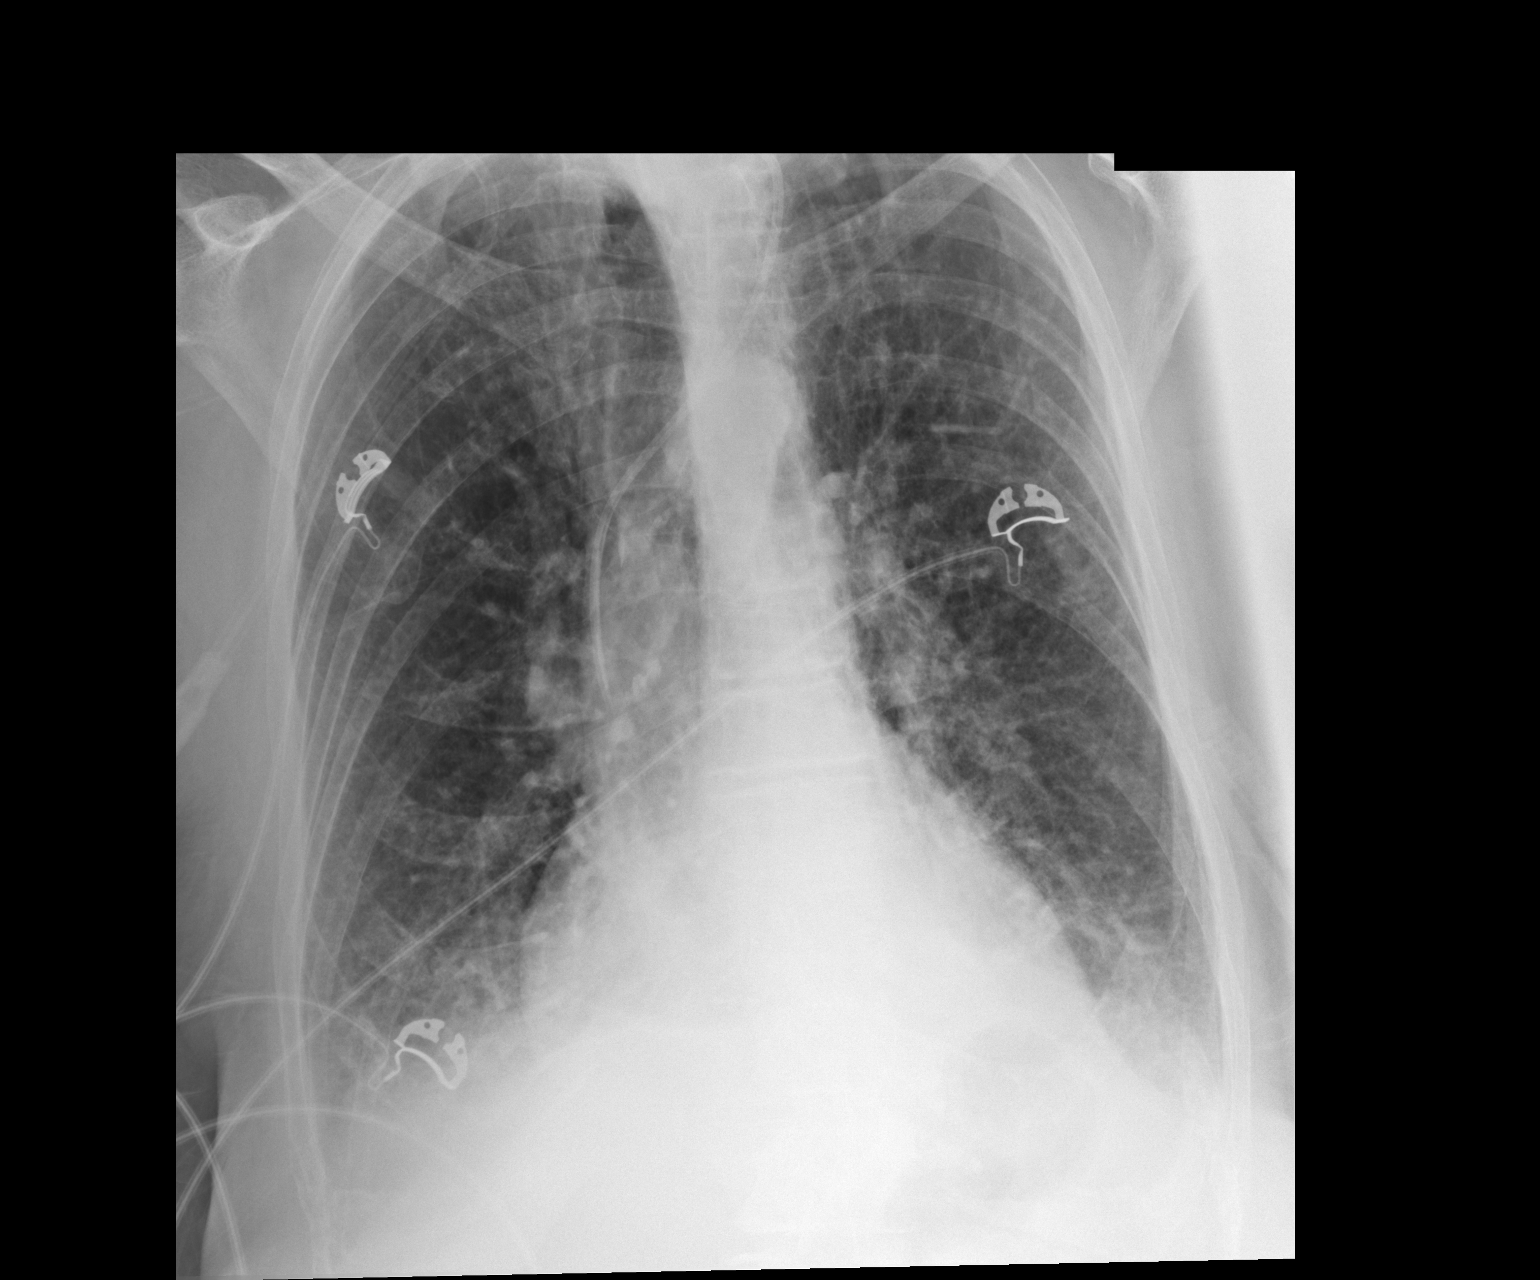

[1 of 1 positions shown; findings below may reference images not displayed]

FINDINGS: The endotracheal tube and the esophagogastric tube have been
removed. Within the right upper hemithorax mildly increased
interstitial markings are demonstrated. There are fewer overlying
soft tissues from structures external to the patient today in this
region and the suspected infiltrate is less conspicuous. Elsewhere
the lungs appear mildly hyperinflated. The hemidiaphragms are less
well demonstrated today which may indicate bibasilar atelectasis or
small amounts of pleural fluid. The cardiopericardial silhouette is
top-normal in size. The pulmonary vascularity is not engorged. A
venous catheter present via the right left internal jugular approach
whose tip position is unchanged in the region of the midportion of
the SVC. The port reservoir and catheter previously demonstrated
over the left axillary region are not evident today.
IMPRESSION: 1. The interstitial infiltrate in the right upper lobe is less
conspicuous today. There is new increased density at the lung bases
which may reflect atelectasis or fluid. The trachea has been
extubated.
2. The cardiopericardial silhouette is top-normal in size and
stable. The pulmonary vascularity is not engorged.
